# Patient Record
Sex: Female | Born: 2000 | Race: Black or African American | Hispanic: No | Marital: Single | Smoking: Never smoker
Health system: Southern US, Community
[De-identification: ages and names within clinical notes are randomized; demographics above are authoritative.]

---

## 2019-05-28 ENCOUNTER — Emergency Department (HOSPITAL_COMMUNITY)
Admission: EM | Admit: 2019-05-28 | Discharge: 2019-05-29 | Disposition: A | Discharge status: Home / Self Care | Payer: 59 | Attending: Emergency Medicine | Admitting: Emergency Medicine

## 2019-05-28 ENCOUNTER — Other Ambulatory Visit: Payer: Self-pay

## 2019-05-28 ENCOUNTER — Encounter (HOSPITAL_COMMUNITY): Payer: Self-pay

## 2019-05-28 ENCOUNTER — Emergency Department (HOSPITAL_COMMUNITY): Payer: 59

## 2019-05-28 DIAGNOSIS — N12 Tubulo-interstitial nephritis, not specified as acute or chronic: Secondary | ICD-10-CM

## 2019-05-28 DIAGNOSIS — N10 Acute tubulo-interstitial nephritis: Secondary | ICD-10-CM | POA: Diagnosis not present

## 2019-05-28 DIAGNOSIS — R0602 Shortness of breath: Secondary | ICD-10-CM | POA: Diagnosis present

## 2019-05-28 LAB — COMPREHENSIVE METABOLIC PANEL
ALT: 26 U/L (ref 0–44)
AST: 25 U/L (ref 15–41)
Albumin: 3.7 g/dL (ref 3.5–5.0)
Alkaline Phosphatase: 44 U/L (ref 38–126)
Anion gap: 12 (ref 5–15)
BUN: 10 mg/dL (ref 6–20)
CO2: 25 mmol/L (ref 22–32)
Calcium: 9.3 mg/dL (ref 8.9–10.3)
Chloride: 97 mmol/L — ABNORMAL LOW (ref 98–111)
Creatinine, Ser: 1.04 mg/dL — ABNORMAL HIGH (ref 0.44–1.00)
GFR calc Af Amer: >60 mL/min (ref >60)
GFR calc non Af Amer: >60 mL/min (ref >60)
Glucose, Bld: 104 mg/dL — ABNORMAL HIGH (ref 70–99)
Potassium: 2.9 mmol/L — ABNORMAL LOW (ref 3.5–5.1)
Sodium: 134 mmol/L — ABNORMAL LOW (ref 135–145)
Total Bilirubin: 1 mg/dL (ref 0.3–1.2)
Total Protein: 7.5 g/dL (ref 6.5–8.1)

## 2019-05-28 LAB — CBC WITH DIFFERENTIAL/PLATELET
Abs Immature Granulocytes: 0.15 10*3/uL — ABNORMAL HIGH (ref 0.00–0.07)
Basophils Absolute: 0.1 10*3/uL (ref 0.0–0.1)
Basophils Relative: 1 %
Eosinophils Absolute: 0 10*3/uL (ref 0.0–0.5)
Eosinophils Relative: 0 %
HCT: 33 % — ABNORMAL LOW (ref 36.0–46.0)
Hemoglobin: 9.9 g/dL — ABNORMAL LOW (ref 12.0–15.0)
Immature Granulocytes: 1 %
Lymphocytes Relative: 5 %
Lymphs Abs: 0.6 10*3/uL — ABNORMAL LOW (ref 0.7–4.0)
MCH: 21.8 pg — ABNORMAL LOW (ref 26.0–34.0)
MCHC: 30 g/dL (ref 30.0–36.0)
MCV: 72.7 fL — ABNORMAL LOW (ref 80.0–100.0)
Monocytes Absolute: 0.5 10*3/uL (ref 0.1–1.0)
Monocytes Relative: 5 %
Neutro Abs: 9.1 10*3/uL — ABNORMAL HIGH (ref 1.7–7.7)
Neutrophils Relative %: 88 %
Platelets: 224 10*3/uL (ref 150–400)
RBC: 4.54 MIL/uL (ref 3.87–5.11)
RDW: 18.4 % — ABNORMAL HIGH (ref 11.5–15.5)
WBC: 10.4 10*3/uL (ref 4.0–10.5)
nRBC: 0 % (ref 0.0–0.2)

## 2019-05-28 LAB — URINALYSIS, ROUTINE W REFLEX MICROSCOPIC
Bilirubin Urine: NEGATIVE
Glucose, UA: NEGATIVE mg/dL
Ketones, ur: 20 mg/dL — AB
Nitrite: POSITIVE — AB
Protein, ur: 30 mg/dL — AB
Specific Gravity, Urine: 1.014 (ref 1.005–1.030)
WBC, UA: >50 WBC/hpf — ABNORMAL HIGH (ref 0–5)
pH: 5 (ref 5.0–8.0)

## 2019-05-28 LAB — I-STAT BETA HCG BLOOD, ED (MC, WL, AP ONLY): I-stat hCG, quantitative: <5 m[IU]/mL (ref <5)

## 2019-05-28 LAB — LACTIC ACID, PLASMA: Lactic Acid, Venous: 1.6 mmol/L (ref 0.5–1.9)

## 2019-05-28 MED ORDER — ACETAMINOPHEN 325 MG PO TABS
650.0000 mg | ORAL_TABLET | Freq: Once | ORAL | Status: AC | PRN
  Administered 2019-05-28: 17:00:00 650 mg via ORAL
  Filled 2019-05-28: qty 2

## 2019-05-28 MED ORDER — ACETAMINOPHEN 325 MG PO TABS
650.0000 mg | ORAL_TABLET | Freq: Once | ORAL | Status: AC
  Administered 2019-05-28: 650 mg via ORAL
  Filled 2019-05-28: qty 2

## 2019-05-28 MED ORDER — SODIUM CHLORIDE 0.9% FLUSH
3.0000 mL | Freq: Once | INTRAVENOUS | Status: AC
  Administered 2019-05-29: 3 mL via INTRAVENOUS

## 2019-05-28 NOTE — ED Triage Notes (Signed)
Referred from [facility] for a sepsis workup, acute pyelonephritis

## 2019-05-29 ENCOUNTER — Other Ambulatory Visit: Payer: Self-pay

## 2019-05-29 DIAGNOSIS — N10 Acute tubulo-interstitial nephritis: Secondary | ICD-10-CM | POA: Diagnosis not present

## 2019-05-29 MED ORDER — POTASSIUM CHLORIDE CRYS ER 20 MEQ PO TBCR
40.0000 meq | EXTENDED_RELEASE_TABLET | Freq: Once | ORAL | Status: AC
  Administered 2019-05-29: 40 meq via ORAL
  Filled 2019-05-29: qty 2

## 2019-05-29 MED ORDER — SODIUM CHLORIDE 0.9 % IV BOLUS (SEPSIS)
1000.0000 mL | Freq: Once | INTRAVENOUS | Status: AC
  Administered 2019-05-29: 1000 mL via INTRAVENOUS

## 2019-05-29 MED ORDER — CEPHALEXIN 500 MG PO CAPS: 500.0000 mg | ORAL_CAPSULE | Freq: Four times a day (QID) | ORAL | 0 refills | Status: DC

## 2019-05-29 MED ORDER — SODIUM CHLORIDE 0.9 % IV SOLN
1.0000 g | Freq: Once | INTRAVENOUS | Status: AC
  Administered 2019-05-29: 04:00:00 1 g via INTRAVENOUS
  Filled 2019-05-29: qty 10

## 2019-05-29 MED ORDER — KETOROLAC TROMETHAMINE 30 MG/ML IJ SOLN
30.0000 mg | Freq: Once | INTRAMUSCULAR | Status: AC
  Administered 2019-05-29: 30 mg via INTRAVENOUS
  Filled 2019-05-29: qty 1

## 2019-05-29 MED ORDER — ONDANSETRON HCL 4 MG/2ML IJ SOLN
4.0000 mg | Freq: Once | INTRAMUSCULAR | Status: AC
  Administered 2019-05-29: 4 mg via INTRAVENOUS
  Filled 2019-05-29: qty 2

## 2019-05-29 NOTE — ED Provider Notes (Signed)
[facility] Provider Note   CSN: [id number] Arrival date & time: 05/28/19  1503     History   Chief Complaint Chief Complaint  Patient presents with  . Shortness of Breath  . Pyelonephritis    HPI Elizabeth Brandt is a 18 y.o. female.     The history is provided by the patient and a parent.  Back Pain Location:  Generalized Quality:  Aching Pain severity:  Moderate Onset quality:  Gradual Timing:  Intermittent Progression:  Unchanged Chronicity:  New Relieved by:  Nothing Worsened by:  Nothing Associated symptoms: abdominal pain and fever   Associated symptoms: no dysuria   Patient is an otherwise healthy female who presents with multiple complaints.  She reports for the past 3 days she has had body aches, fevers, chills, headache.  She also reports abdominal pain as well as back pain.  She has had mild cough in the past 24 hours She was seen at an urgent care and told she had a urine infection and possible sepsis and was sent to the ER. She denies dysuria or urinary symptoms  PMH-none Soc hx - nonsmoker OB History   No obstetric history on file.      Home Medications    Prior to Admission medications   Not on File    Family History History reviewed. No pertinent family history.  Social History Social History   Tobacco Use  . Smoking status: Not on file  Substance Use Topics  . Alcohol use: Not on file  . Drug use: Not on file     Allergies   Patient has no known allergies.   Review of Systems Review of Systems  Constitutional: Positive for fever.  Respiratory: Positive for cough.   Gastrointestinal: Positive for abdominal pain.  Genitourinary: Negative for dysuria and vaginal bleeding.  Musculoskeletal: Positive for back pain.  All other systems reviewed and are negative.    Physical Exam Updated Vital Signs BP 111/70 (BP Location: Right Arm)   Pulse 95   Temp (!) 102.2 F (39 C) (Oral)    Resp 18   LMP 05/14/2019   SpO2 100%   Physical Exam CONSTITUTIONAL: Well developed/well nourished HEAD: Normocephalic/atraumatic EYES: EOMI/PERRL ENMT: Mucous membranes moist NECK: supple no meningeal signs SPINE/BACK:entire spine nontender CV: S1/S2 noted, no murmurs/rubs/gallops noted LUNGS: Lungs are clear to auscultation bilaterally, no apparent distress ABDOMEN: soft, nontender, no rebound or guarding, bowel sounds noted throughout abdomen GU: Right cva tenderness NEURO: Pt is awake/alert/appropriate, moves all extremitiesx4.  No facial droop.   EXTREMITIES: pulses normal/equal, full ROM SKIN: warm, color normal PSYCH: no abnormalities of mood noted, alert and oriented to situation   ED Treatments / Results  Labs (all labs ordered are listed, but only abnormal results are displayed) Labs Reviewed  COMPREHENSIVE METABOLIC PANEL - Abnormal; Notable for the following components:      Result Value   Sodium 134 (*)    Potassium 2.9 (*)    Chloride 97 (*)    Glucose, Bld 104 (*)    Creatinine, Ser 1.04 (*)    All other components within normal limits  CBC WITH DIFFERENTIAL/PLATELET - Abnormal; Notable for the following components:   Hemoglobin 9.9 (*)    HCT 33.0 (*)    MCV 72.7 (*)    MCH 21.8 (*)    RDW 18.4 (*)    Neutro Abs 9.1 (*)    Lymphs Abs 0.6 (*)    Abs Immature Granulocytes 0.15 (*)  All other components within normal limits  URINALYSIS, ROUTINE W REFLEX MICROSCOPIC - Abnormal; Notable for the following components:   Color, Urine AMBER (*)    APPearance HAZY (*)    Hgb urine dipstick SMALL (*)    Ketones, ur 20 (*)    Protein, ur 30 (*)    Nitrite POSITIVE (*)    Leukocytes,Ua LARGE (*)    WBC, UA >50 (*)    Bacteria, UA FEW (*)    All other components within normal limits  CULTURE, BLOOD (ROUTINE X 2)  URINE CULTURE  LACTIC ACID, PLASMA  I-STAT BETA HCG BLOOD, ED ([facility], [facility], [facility] ONLY)    EKG None  Radiology Dg Chest 2 View  Result Date:  05/28/2019 CLINICAL DATA:  Shortness of breath, fever, and chest pain EXAM: CHEST - 2 VIEW COMPARISON:  None. FINDINGS: Lungs are clear. Heart size and pulmonary vascularity are normal. No adenopathy. No bone lesions. No pneumothorax. IMPRESSION: No edema or consolidation.  No evident adenopathy. Electronically Signed   By: Lowella Grip III M.D.   On: 05/28/2019 16:07    Procedures Procedures   Medications Ordered in ED Medications  sodium chloride flush (NS) 0.9 % injection 3 mL (3 mLs Intravenous Given 05/29/19 0450)  acetaminophen (TYLENOL) tablet 650 mg (650 mg Oral Given 05/28/19 1628)  acetaminophen (TYLENOL) tablet 650 mg (650 mg Oral Given 05/28/19 1717)  sodium chloride 0.9 % bolus 1,000 mL (0 mLs Intravenous Stopped 05/29/19 0545)  ondansetron (ZOFRAN) injection 4 mg (4 mg Intravenous Given 05/29/19 0352)  ketorolac (TORADOL) 30 MG/ML injection 30 mg (30 mg Intravenous Given 05/29/19 0353)  cefTRIAXone (ROCEPHIN) 1 g in sodium chloride 0.9 % 100 mL IVPB (0 g Intravenous Stopped 05/29/19 0450)  potassium chloride SA (K-DUR) CR tablet 40 mEq (40 mEq Oral Given 05/29/19 0352)     Initial Impression / Assessment and Plan / ED Course  I have reviewed the triage vital signs and the nursing notes.  Pertinent labs & imaging results that were available during my care of the patient were reviewed by me and considered in my medical decision making (see chart for details).        3:40 AM Patient appears to have pyelonephritis.  She is not septic appearing at this time.  She will be given IV fluids as well as IV Rocephin. Anticipate discharge 5:52 AM Patient improved.  She is ambulatory.  She is not septic appearing. Plan to discharge home for treatment for pyelonephritis.  Patient agreed with plan.  We discussed return precautions Final Clinical Impressions(s) / ED Diagnoses   Final diagnoses:  Pyelonephritis    ED Discharge Orders         Ordered    cephALEXin (KEFLEX) 500 MG  capsule  4 times daily     05/29/19 0542           Ripley Fraise, MD 05/29/19 [phone]

## 2019-05-31 LAB — URINE CULTURE: Culture: >=100000 — AB

## 2019-06-01 ENCOUNTER — Telehealth (HOSPITAL_BASED_OUTPATIENT_CLINIC_OR_DEPARTMENT_OTHER): Payer: Self-pay

## 2019-06-01 NOTE — Telephone Encounter (Signed)
Post ED Visit - Positive Culture Follow-up  Culture report reviewed by antimicrobial stewardship pharmacist: [place] Team []  Elenor Quinones, Pharm.D. []  Heide Guile, Pharm.D., BCPS AQ-ID []  Parks Neptune, Pharm.D., BCPS []  Alycia Rossetti, Pharm.D., BCPS []  [place], Pharm.D., BCPS, AAHIVP []  Legrand Como, Pharm.D., BCPS, AAHIVP []  Salome Arnt, PharmD, BCPS []  Johnnette Gourd, PharmD, BCPS []  Hughes Better, PharmD, BCPS []  Leeroy Cha, PharmD []  Laqueta Linden, PharmD, BCPS []  Albertina Parr, PharmD Stacy Gardner.D.  [place] Team []  Leodis Sias, PharmD []  Lindell Spar, PharmD []  Royetta Asal, PharmD []  Graylin Shiver, Rph []  Rema Fendt) Glennon Mac, PharmD []  Arlyn Dunning, PharmD []  Netta Cedars, PharmD []  Dia Sitter, PharmD []  Leone Haven, PharmD []  Gretta Arab, PharmD []  Theodis Shove, PharmD []  Peggyann Juba, PharmD []  Reuel Boom, PharmD   Positive urine culture Treated with Cephalexin, organism sensitive to the same and no further patient follow-up is required at this time.  Dortha Kern 06/01/2019, 2:22 PM

## 2019-06-02 LAB — CULTURE, BLOOD (ROUTINE X 2)
Culture: NO GROWTH
Special Requests: ADEQUATE

## 2020-09-04 DIAGNOSIS — Z01419 Encounter for gynecological examination (general) (routine) without abnormal findings: Secondary | ICD-10-CM | POA: Diagnosis not present

## 2020-09-04 DIAGNOSIS — Z6832 Body mass index (BMI) 32.0-32.9, adult: Secondary | ICD-10-CM | POA: Diagnosis not present

## 2020-09-04 DIAGNOSIS — Z113 Encounter for screening for infections with a predominantly sexual mode of transmission: Secondary | ICD-10-CM | POA: Diagnosis not present

## 2020-09-04 DIAGNOSIS — E282 Polycystic ovarian syndrome: Secondary | ICD-10-CM | POA: Insufficient documentation

## 2020-09-18 DIAGNOSIS — J069 Acute upper respiratory infection, unspecified: Secondary | ICD-10-CM | POA: Diagnosis not present

## 2020-09-18 DIAGNOSIS — Z9189 Other specified personal risk factors, not elsewhere classified: Secondary | ICD-10-CM | POA: Diagnosis not present

## 2020-09-20 DIAGNOSIS — L700 Acne vulgaris: Secondary | ICD-10-CM | POA: Diagnosis not present

## 2020-09-20 DIAGNOSIS — L218 Other seborrheic dermatitis: Secondary | ICD-10-CM | POA: Diagnosis not present

## 2020-09-20 DIAGNOSIS — Z79899 Other long term (current) drug therapy: Secondary | ICD-10-CM | POA: Diagnosis not present

## 2020-09-22 DIAGNOSIS — Z79899 Other long term (current) drug therapy: Secondary | ICD-10-CM | POA: Diagnosis not present

## 2020-10-20 DIAGNOSIS — L700 Acne vulgaris: Secondary | ICD-10-CM | POA: Diagnosis not present

## 2020-10-20 DIAGNOSIS — Z79899 Other long term (current) drug therapy: Secondary | ICD-10-CM | POA: Diagnosis not present

## 2020-11-21 DIAGNOSIS — N926 Irregular menstruation, unspecified: Secondary | ICD-10-CM | POA: Diagnosis not present

## 2020-11-21 DIAGNOSIS — N939 Abnormal uterine and vaginal bleeding, unspecified: Secondary | ICD-10-CM | POA: Diagnosis not present

## 2020-11-22 DIAGNOSIS — Z3202 Encounter for pregnancy test, result negative: Secondary | ICD-10-CM | POA: Diagnosis not present

## 2020-11-22 DIAGNOSIS — L700 Acne vulgaris: Secondary | ICD-10-CM | POA: Diagnosis not present

## 2020-11-22 DIAGNOSIS — Z79899 Other long term (current) drug therapy: Secondary | ICD-10-CM | POA: Diagnosis not present

## 2020-12-25 DIAGNOSIS — L700 Acne vulgaris: Secondary | ICD-10-CM | POA: Diagnosis not present

## 2020-12-25 DIAGNOSIS — Z79899 Other long term (current) drug therapy: Secondary | ICD-10-CM | POA: Diagnosis not present

## 2021-01-25 DIAGNOSIS — Z79899 Other long term (current) drug therapy: Secondary | ICD-10-CM | POA: Diagnosis not present

## 2021-01-25 DIAGNOSIS — L700 Acne vulgaris: Secondary | ICD-10-CM | POA: Diagnosis not present

## 2021-02-26 DIAGNOSIS — L700 Acne vulgaris: Secondary | ICD-10-CM | POA: Diagnosis not present

## 2021-03-29 DIAGNOSIS — L700 Acne vulgaris: Secondary | ICD-10-CM | POA: Diagnosis not present

## 2021-03-29 DIAGNOSIS — Z79899 Other long term (current) drug therapy: Secondary | ICD-10-CM | POA: Diagnosis not present

## 2021-04-03 ENCOUNTER — Inpatient Hospital Stay (HOSPITAL_COMMUNITY): Payer: 59

## 2021-04-03 ENCOUNTER — Emergency Department (HOSPITAL_COMMUNITY): Payer: 59

## 2021-04-03 ENCOUNTER — Inpatient Hospital Stay (HOSPITAL_COMMUNITY)
Admission: EM | Admit: 2021-04-03 | Discharge: 2021-04-07 | DRG: 917 | Disposition: A | Discharge status: Other Acute Inpatient Hospital | Payer: 59 | Attending: Internal Medicine | Admitting: Internal Medicine

## 2021-04-03 ENCOUNTER — Other Ambulatory Visit: Payer: Self-pay

## 2021-04-03 DIAGNOSIS — E872 Acidosis: Secondary | ICD-10-CM | POA: Diagnosis not present

## 2021-04-03 DIAGNOSIS — T39312A Poisoning by propionic acid derivatives, intentional self-harm, initial encounter: Secondary | ICD-10-CM | POA: Diagnosis present

## 2021-04-03 DIAGNOSIS — T391X2A Poisoning by 4-Aminophenol derivatives, intentional self-harm, initial encounter: Secondary | ICD-10-CM

## 2021-04-03 DIAGNOSIS — T470X2A Poisoning by histamine H2-receptor blockers, intentional self-harm, initial encounter: Secondary | ICD-10-CM | POA: Diagnosis present

## 2021-04-03 DIAGNOSIS — Z452 Encounter for adjustment and management of vascular access device: Secondary | ICD-10-CM | POA: Diagnosis not present

## 2021-04-03 DIAGNOSIS — R000 Tachycardia, unspecified: Secondary | ICD-10-CM | POA: Diagnosis not present

## 2021-04-03 DIAGNOSIS — R52 Pain, unspecified: Secondary | ICD-10-CM | POA: Diagnosis not present

## 2021-04-03 DIAGNOSIS — R4020 Unspecified coma: Secondary | ICD-10-CM | POA: Diagnosis not present

## 2021-04-03 DIAGNOSIS — J9601 Acute respiratory failure with hypoxia: Secondary | ICD-10-CM | POA: Diagnosis not present

## 2021-04-03 DIAGNOSIS — J9690 Respiratory failure, unspecified, unspecified whether with hypoxia or hypercapnia: Secondary | ICD-10-CM

## 2021-04-03 DIAGNOSIS — G253 Myoclonus: Secondary | ICD-10-CM | POA: Diagnosis present

## 2021-04-03 DIAGNOSIS — N179 Acute kidney failure, unspecified: Secondary | ICD-10-CM | POA: Diagnosis not present

## 2021-04-03 DIAGNOSIS — G928 Other toxic encephalopathy: Secondary | ICD-10-CM | POA: Diagnosis present

## 2021-04-03 DIAGNOSIS — R4182 Altered mental status, unspecified: Secondary | ICD-10-CM | POA: Diagnosis not present

## 2021-04-03 DIAGNOSIS — T50901A Poisoning by unspecified drugs, medicaments and biological substances, accidental (unintentional), initial encounter: Secondary | ICD-10-CM | POA: Diagnosis present

## 2021-04-03 DIAGNOSIS — E669 Obesity, unspecified: Secondary | ICD-10-CM | POA: Diagnosis present

## 2021-04-03 DIAGNOSIS — Z4682 Encounter for fitting and adjustment of non-vascular catheter: Secondary | ICD-10-CM | POA: Diagnosis not present

## 2021-04-03 DIAGNOSIS — G9340 Encephalopathy, unspecified: Secondary | ICD-10-CM | POA: Diagnosis not present

## 2021-04-03 DIAGNOSIS — Z20822 Contact with and (suspected) exposure to covid-19: Secondary | ICD-10-CM | POA: Diagnosis not present

## 2021-04-03 DIAGNOSIS — T50902A Poisoning by unspecified drugs, medicaments and biological substances, intentional self-harm, initial encounter: Secondary | ICD-10-CM | POA: Diagnosis not present

## 2021-04-03 DIAGNOSIS — Y92009 Unspecified place in unspecified non-institutional (private) residence as the place of occurrence of the external cause: Secondary | ICD-10-CM

## 2021-04-03 DIAGNOSIS — R0902 Hypoxemia: Secondary | ICD-10-CM | POA: Diagnosis not present

## 2021-04-03 DIAGNOSIS — Z6831 Body mass index (BMI) 31.0-31.9, adult: Secondary | ICD-10-CM | POA: Diagnosis not present

## 2021-04-03 DIAGNOSIS — F32A Depression, unspecified: Secondary | ICD-10-CM | POA: Diagnosis present

## 2021-04-03 DIAGNOSIS — R9431 Abnormal electrocardiogram [ECG] [EKG]: Secondary | ICD-10-CM | POA: Diagnosis present

## 2021-04-03 DIAGNOSIS — E876 Hypokalemia: Secondary | ICD-10-CM | POA: Diagnosis present

## 2021-04-03 DIAGNOSIS — R404 Transient alteration of awareness: Secondary | ICD-10-CM | POA: Diagnosis not present

## 2021-04-03 HISTORY — DX: Poisoning by 4-aminophenol derivatives, intentional self-harm, initial encounter: T39.1X2A

## 2021-04-03 LAB — CBC WITH DIFFERENTIAL/PLATELET
Abs Immature Granulocytes: 0.03 10*3/uL (ref 0.00–0.07)
Basophils Absolute: 0.1 10*3/uL (ref 0.0–0.1)
Basophils Relative: 1 %
Eosinophils Absolute: 0 10*3/uL (ref 0.0–0.5)
Eosinophils Relative: 0 %
HCT: 42.6 % (ref 36.0–46.0)
Hemoglobin: 12.4 g/dL (ref 12.0–15.0)
Immature Granulocytes: 0 %
Lymphocytes Relative: 20 %
Lymphs Abs: 1.5 10*3/uL (ref 0.7–4.0)
MCH: 24.8 pg — ABNORMAL LOW (ref 26.0–34.0)
MCHC: 29.1 g/dL — ABNORMAL LOW (ref 30.0–36.0)
MCV: 85 fL (ref 80.0–100.0)
Monocytes Absolute: 0.7 10*3/uL (ref 0.1–1.0)
Monocytes Relative: 9 %
Neutro Abs: 5.2 10*3/uL (ref 1.7–7.7)
Neutrophils Relative %: 70 %
Platelets: 277 10*3/uL (ref 150–400)
RBC: 5.01 MIL/uL (ref 3.87–5.11)
RDW: 16.9 % — ABNORMAL HIGH (ref 11.5–15.5)
WBC: 7.5 10*3/uL (ref 4.0–10.5)
nRBC: 0 % (ref 0.0–0.2)

## 2021-04-03 LAB — I-STAT ARTERIAL BLOOD GAS, ED
Acid-base deficit: 10 mmol/L — ABNORMAL HIGH (ref 0.0–2.0)
Bicarbonate: 16.3 mmol/L — ABNORMAL LOW (ref 20.0–28.0)
Calcium, Ion: 1.12 mmol/L — ABNORMAL LOW (ref 1.15–1.40)
HCT: 39 % (ref 36.0–46.0)
Hemoglobin: 13.3 g/dL (ref 12.0–15.0)
O2 Saturation: 100 %
Potassium: 3.8 mmol/L (ref 3.5–5.1)
Sodium: 139 mmol/L (ref 135–145)
TCO2: 17 mmol/L — ABNORMAL LOW (ref 22–32)
pCO2 arterial: 37.5 mmHg (ref 32.0–48.0)
pH, Arterial: 7.246 — ABNORMAL LOW (ref 7.350–7.450)
pO2, Arterial: 540 mmHg — ABNORMAL HIGH (ref 83.0–108.0)

## 2021-04-03 LAB — POCT I-STAT 7, (LYTES, BLD GAS, ICA,H+H)
Acid-base deficit: 12 mmol/L — ABNORMAL HIGH (ref 0.0–2.0)
Bicarbonate: 13.1 mmol/L — ABNORMAL LOW (ref 20.0–28.0)
Calcium, Ion: 1.25 mmol/L (ref 1.15–1.40)
HCT: 43 % (ref 36.0–46.0)
Hemoglobin: 14.6 g/dL (ref 12.0–15.0)
O2 Saturation: 99 %
Patient temperature: 98.4
Potassium: 4.1 mmol/L (ref 3.5–5.1)
Sodium: 144 mmol/L (ref 135–145)
TCO2: 14 mmol/L — ABNORMAL LOW (ref 22–32)
pCO2 arterial: 28.2 mmHg — ABNORMAL LOW (ref 32.0–48.0)
pH, Arterial: 7.274 — ABNORMAL LOW (ref 7.350–7.450)
pO2, Arterial: 149 mmHg — ABNORMAL HIGH (ref 83.0–108.0)

## 2021-04-03 LAB — SALICYLATE LEVEL: Salicylate Lvl: <7 mg/dL — ABNORMAL LOW (ref 7.0–30.0)

## 2021-04-03 LAB — COMPREHENSIVE METABOLIC PANEL
ALT: 18 U/L (ref 0–44)
AST: 28 U/L (ref 15–41)
Albumin: 3.7 g/dL (ref 3.5–5.0)
Alkaline Phosphatase: 41 U/L (ref 38–126)
Anion gap: 20 — ABNORMAL HIGH (ref 5–15)
BUN: 12 mg/dL (ref 6–20)
CO2: 13 mmol/L — ABNORMAL LOW (ref 22–32)
Calcium: 9 mg/dL (ref 8.9–10.3)
Chloride: 107 mmol/L (ref 98–111)
Creatinine, Ser: 1.12 mg/dL — ABNORMAL HIGH (ref 0.44–1.00)
GFR, Estimated: >60 mL/min (ref >60)
Glucose, Bld: 134 mg/dL — ABNORMAL HIGH (ref 70–99)
Potassium: 3.6 mmol/L (ref 3.5–5.1)
Sodium: 140 mmol/L (ref 135–145)
Total Bilirubin: 0.2 mg/dL — ABNORMAL LOW (ref 0.3–1.2)
Total Protein: 6.8 g/dL (ref 6.5–8.1)

## 2021-04-03 LAB — PHOSPHORUS: Phosphorus: 3.1 mg/dL (ref 2.5–4.6)

## 2021-04-03 LAB — HEPATIC FUNCTION PANEL
ALT: 22 U/L (ref 0–44)
AST: 25 U/L (ref 15–41)
Albumin: 4 g/dL (ref 3.5–5.0)
Alkaline Phosphatase: 42 U/L (ref 38–126)
Bilirubin, Direct: <0.1 mg/dL (ref 0.0–0.2)
Total Bilirubin: 0.6 mg/dL (ref 0.3–1.2)
Total Protein: 7.6 g/dL (ref 6.5–8.1)

## 2021-04-03 LAB — HIV ANTIBODY (ROUTINE TESTING W REFLEX): HIV Screen 4th Generation wRfx: NONREACTIVE

## 2021-04-03 LAB — BASIC METABOLIC PANEL
Anion gap: 15 (ref 5–15)
BUN: 9 mg/dL (ref 6–20)
CO2: 14 mmol/L — ABNORMAL LOW (ref 22–32)
Calcium: 8.9 mg/dL (ref 8.9–10.3)
Chloride: 111 mmol/L (ref 98–111)
Creatinine, Ser: 1.15 mg/dL — ABNORMAL HIGH (ref 0.44–1.00)
GFR, Estimated: >60 mL/min (ref >60)
Glucose, Bld: 171 mg/dL — ABNORMAL HIGH (ref 70–99)
Potassium: 4.5 mmol/L (ref 3.5–5.1)
Sodium: 140 mmol/L (ref 135–145)

## 2021-04-03 LAB — RAPID URINE DRUG SCREEN, HOSP PERFORMED
Amphetamines: NOT DETECTED
Barbiturates: NOT DETECTED
Benzodiazepines: NOT DETECTED
Cocaine: NOT DETECTED
Opiates: NOT DETECTED
Tetrahydrocannabinol: NOT DETECTED

## 2021-04-03 LAB — AMMONIA: Ammonia: 37 umol/L — ABNORMAL HIGH (ref 9–35)

## 2021-04-03 LAB — MAGNESIUM: Magnesium: 2.2 mg/dL (ref 1.7–2.4)

## 2021-04-03 LAB — GLUCOSE, CAPILLARY
Glucose-Capillary: 170 mg/dL — ABNORMAL HIGH (ref 70–99)
Glucose-Capillary: 133 mg/dL — ABNORMAL HIGH (ref 70–99)

## 2021-04-03 LAB — MRSA NEXT GEN BY PCR, NASAL: MRSA by PCR Next Gen: NOT DETECTED

## 2021-04-03 LAB — ACETAMINOPHEN LEVEL
Acetaminophen (Tylenol), Serum: 110 ug/mL — ABNORMAL HIGH (ref 10–30)
Acetaminophen (Tylenol), Serum: 67 ug/mL — ABNORMAL HIGH (ref 10–30)

## 2021-04-03 LAB — ETHANOL: Alcohol, Ethyl (B): <10 mg/dL (ref <10)

## 2021-04-03 LAB — CBG MONITORING, ED: Glucose-Capillary: 128 mg/dL — ABNORMAL HIGH (ref 70–99)

## 2021-04-03 LAB — PROTIME-INR
INR: 1.3 — ABNORMAL HIGH (ref 0.8–1.2)
Prothrombin Time: 16.5 seconds — ABNORMAL HIGH (ref 11.4–15.2)

## 2021-04-03 LAB — TROPONIN I (HIGH SENSITIVITY)
Troponin I (High Sensitivity): 10 ng/L (ref <18)
Troponin I (High Sensitivity): 17 ng/L (ref <18)

## 2021-04-03 LAB — OSMOLALITY: Osmolality: 322 mOsm/kg (ref 275–295)

## 2021-04-03 LAB — OSMOLALITY, URINE: Osmolality, Ur: 372 mOsm/kg (ref 300–900)

## 2021-04-03 LAB — SODIUM, URINE, RANDOM: Sodium, Ur: 84 mmol/L

## 2021-04-03 MED ORDER — ACETYLCYSTEINE LOAD VIA INFUSION: 150.0000 mg/kg | Freq: Once | INTRAVENOUS | Status: DC

## 2021-04-03 MED ORDER — ORAL CARE MOUTH RINSE
15.0000 mL | OROMUCOSAL | Status: DC
  Administered 2021-04-03 – 2021-04-04 (×7): 15 mL via OROMUCOSAL

## 2021-04-03 MED ORDER — SODIUM CHLORIDE 0.9 % IV BOLUS
1000.0000 mL | Freq: Once | INTRAVENOUS | Status: AC
  Administered 2021-04-03: 1000 mL via INTRAVENOUS

## 2021-04-03 MED ORDER — POTASSIUM CHLORIDE 20 MEQ PO PACK: 40.0000 meq | PACK | Freq: Once | ORAL | Status: DC

## 2021-04-03 MED ORDER — PROPOFOL 1000 MG/100ML IV EMUL
5.0000 ug/kg/min | INTRAVENOUS | Status: DC
Start: 2021-04-03 — End: 2021-04-04
  Administered 2021-04-03: 20 ug/kg/min via INTRAVENOUS
  Administered 2021-04-03 – 2021-04-04 (×4): 50 ug/kg/min via INTRAVENOUS
  Filled 2021-04-03 (×2): qty 100

## 2021-04-03 MED ORDER — POLYETHYLENE GLYCOL 3350 17 G PO PACK: 17.0000 g | PACK | Freq: Every day | ORAL | Status: DC | PRN

## 2021-04-03 MED ORDER — PANTOPRAZOLE SODIUM 40 MG IV SOLR
40.0000 mg | INTRAVENOUS | Status: DC
  Administered 2021-04-03: 40 mg via INTRAVENOUS
  Filled 2021-04-03 (×2): qty 40

## 2021-04-03 MED ORDER — ROCURONIUM BROMIDE 50 MG/5ML IV SOLN
INTRAVENOUS | Status: AC | PRN
  Administered 2021-04-03: 100 mg via INTRAVENOUS

## 2021-04-03 MED ORDER — HEPARIN SODIUM (PORCINE) 5000 UNIT/ML IJ SOLN
5000.0000 [IU] | Freq: Three times a day (TID) | INTRAMUSCULAR | Status: DC
  Administered 2021-04-03 – 2021-04-06 (×7): 5000 [IU] via SUBCUTANEOUS
  Filled 2021-04-03 (×8): qty 1

## 2021-04-03 MED ORDER — FENTANYL CITRATE (PF) 100 MCG/2ML IJ SOLN: 50.0000 ug | INTRAMUSCULAR | Status: DC | PRN

## 2021-04-03 MED ORDER — CHLORHEXIDINE GLUCONATE CLOTH 2 % EX PADS
6.0000 | MEDICATED_PAD | Freq: Every day | CUTANEOUS | Status: DC
  Administered 2021-04-03 – 2021-04-04 (×2): 6 via TOPICAL

## 2021-04-03 MED ORDER — CHARCOAL ACTIVATED PO LIQD
100.0000 g | Freq: Once | ORAL | Status: AC
  Administered 2021-04-03: 100 g

## 2021-04-03 MED ORDER — DEXTROSE 5 % IV SOLN
15.0000 mg/kg/h | INTRAVENOUS | Status: DC
  Administered 2021-04-03 – 2021-04-04 (×3): 15 mg/kg/h via INTRAVENOUS
  Filled 2021-04-03 (×4): qty 120

## 2021-04-03 MED ORDER — SODIUM BICARBONATE 8.4 % IV SOLN
100.0000 meq | Freq: Once | INTRAVENOUS | Status: AC
  Administered 2021-04-03: 50 meq via INTRAVENOUS
  Filled 2021-04-03: qty 50

## 2021-04-03 MED ORDER — CHARCOAL ACTIVATED PO LIQD: 100.0000 g | Freq: Once | ORAL | Status: DC

## 2021-04-03 MED ORDER — ETOMIDATE 2 MG/ML IV SOLN
INTRAVENOUS | Status: AC | PRN
  Administered 2021-04-03: 20 mg via INTRAVENOUS

## 2021-04-03 MED ORDER — DOCUSATE SODIUM 100 MG PO CAPS
100.0000 mg | ORAL_CAPSULE | Freq: Two times a day (BID) | ORAL | Status: DC | PRN
Start: 2021-04-03 — End: 2021-04-04

## 2021-04-03 MED ORDER — CHLORHEXIDINE GLUCONATE 0.12% ORAL RINSE (MEDLINE KIT)
15.0000 mL | Freq: Two times a day (BID) | OROMUCOSAL | Status: DC
  Administered 2021-04-03 – 2021-04-04 (×2): 15 mL via OROMUCOSAL

## 2021-04-03 MED ORDER — SODIUM BICARBONATE 8.4 % IV SOLN
INTRAVENOUS | Status: DC
  Filled 2021-04-03 (×4): qty 1000

## 2021-04-03 MED ORDER — ACETYLCYSTEINE LOAD VIA INFUSION
150.0000 mg/kg | Freq: Once | INTRAVENOUS | Status: AC
  Administered 2021-04-03: 13875 mg via INTRAVENOUS
  Filled 2021-04-03: qty 347

## 2021-04-03 MED ORDER — POTASSIUM CHLORIDE 10 MEQ/100ML IV SOLN
10.0000 meq | INTRAVENOUS | Status: AC
Start: 2021-04-03 — End: 2021-04-04
  Administered 2021-04-03 (×4): 10 meq via INTRAVENOUS
  Filled 2021-04-03 (×4): qty 100

## 2021-04-03 NOTE — Code Documentation (Signed)
Pt vomiting upon arrival, [facility] elevated & mouth suctioned. Room set up for intubation.

## 2021-04-03 NOTE — ED Notes (Signed)
OG tube flushed with 50 ml of sterile water then clamped

## 2021-04-03 NOTE — Progress Notes (Signed)
EEG complete - results pending 

## 2021-04-03 NOTE — Progress Notes (Signed)
ABG results given to RN at this time. 

## 2021-04-03 NOTE — Procedures (Signed)
[name]  MRN: [id number]  Epilepsy Attending: Charlsie Quest  Referring Physician/Provider: Dr Lupita Leash, Date: 04/03/2021 Duration: 28.51 mins  Patient history: 19yo s/p intentional overdose. EEG to evaluate for seizure  Level of alertness:  comatose  AEDs during EEG study: Propofol  Technical aspects: This EEG study was done with scalp electrodes positioned according to the 10-20 International system of electrode placement. Electrical activity was acquired at a sampling rate of 500Hz  and reviewed with a high frequency filter of 70Hz  and a low frequency filter of 1Hz . EEG data were recorded continuously and digitally stored.   Description: EEG showed continuous generalized 2-3Hz  delta slowing admixed with an excessive amount of 15 to 18 Hz beta activity distributed symmetrically and diffusely. Hyperventilation and photic stimulation were not performed.     ABNORMALITY - Continuous slow, generalized - Excessive beta, generalized  IMPRESSION: This study is suggestive of severe diffuse encephalopathy, nonspecific etiology but likely related to sedation. The excessive beta activity seen in the background is most likely due to the effect of benzodiazepine and is a benign EEG pattern. No seizures or epileptiform discharges were seen throughout the recording.  [name] 

## 2021-04-03 NOTE — Progress Notes (Signed)
Poison Control Consult Documentation  [place] [facility] has been contacted by request of NP Selmer Dominion due to the patient's intentional overdose on acetaminophen and duexis. Patient LKW at 0900 7/26 when mom returned home today (7/26) at 1400 patient was unresponsive and empty APAP and duexis bottle found near her - Pt also believed to have ingested some HEMP/CBD lotion as well.  Labs: Tylenol level 110; Salicylate level not detectable; Ethanol not detectable  Summary  of [place] Poison Control Recommendations: Spoke with Gina at [facility].  Treatment Recommendations: (see [facility] NAC Fact sheet below signature line of this note)  -If tylenol level at baseline returns positive use that for determining treatment with matthew rubrick nomogram using 0900 as ingestion time. (Level returned at 110 mcg/ml). - Will re-check level at 1900 as well (4 hours post treatment start) -Maintain on cardiac monitor -Acidosis may be attributable to Duexis - agree with sodium bicarb -Have mom go through meds in the house wouldn't expect this degree of CNS depression from tylenol alone -Optimize potassium and magnesium -Avoid qt/qtc prolonging medications -Treat seizures with benzodiazepines 1st line and phenobarbital 2nd line  Recommendations provided to requesting physician.  Delmar Landau, PharmD, BCPS Emergency Medicine Clinical Pharmacist 04/03/2021 5:03 PM

## 2021-04-03 NOTE — Procedures (Signed)
Central Venous Catheter Insertion Procedure Note  Elizabeth Brandt  [id number]  July 02, 2001  Date:04/03/21  Time:6:23 PM   Provider Performing:Maeley Matton D Suzie Portela   Procedure: Insertion of Non-tunneled Central Venous [phone]) with US guidance (38101)   Indication(s) Medication administration  Consent Risks of the procedure as well as the alternatives and risks of each were explained to the patient and/or caregiver.  Consent for the procedure was obtained and is signed in the bedside chart  Anesthesia Topical only with 1% lidocaine   Timeout Verified patient identification, verified procedure, site/side was marked, verified correct patient position, special equipment/implants available, medications/allergies/relevant history reviewed, required imaging and test results available.  Sterile Technique Maximal sterile technique including full sterile barrier drape, hand hygiene, sterile gown, sterile gloves, mask, hair covering, sterile ultrasound probe cover (if used).  Procedure Description Area of catheter insertion was cleaned with chlorhexidine and draped in sterile fashion.  With real-time ultrasound guidance a central venous catheter was placed into the left internal jugular vein. Nonpulsatile blood flow and easy flushing noted in all ports.  The catheter was sutured in place and sterile dressing applied.     Complications/Tolerance None; patient tolerated the procedure well. Chest X-ray is ordered to verify placement for internal jugular or subclavian cannulation.   Chest x-ray is not ordered for femoral cannulation.  EBL Minimal  Specimen(s) None  JD Anselm Lis [facility] 04/03/2021, 6:23 PM  Please see [email or web address] for pager details.  From 7A-7P if no response, please call [phone]. After hours, please call ELink [phone].

## 2021-04-03 NOTE — Progress Notes (Signed)
Ventilator patient transported from ED31 to CT to 2M04 withou any complications.

## 2021-04-03 NOTE — Progress Notes (Signed)
Pt.'s mother, Carvel Getting, has taken patient's jewelery home including one ring, two nose piercings, two bracelets, one pair of earrings, and a necklace.

## 2021-04-03 NOTE — H&P (Signed)
NAME:  Elizabeth Brandt, MRN:  [id number], DOB:  2001-02-09, LOS: 0 ADMISSION DATE:  04/03/2021, CONSULTATION DATE:  04/03/2021 REFERRING MD:  Dr. Audley Hose, CHIEF COMPLAINT:  Overdose/ AMS   History of Present Illness:  HPI obtained from medical chart review and per mother at bedside, as patient remains sedated and intubated on mechanical ventilation.  20 year old female with no prior medical history, non smoker, non drinker per mother, does not take regular medications who was found altered and minimally responsive at home.   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  She came home around 0900 however her mother had to leave for an appointment, but reported she was tired and going to bed.  Mother states she was contacted by patient's boyfriend of two years, that they broke up this morning and he was concerned cause she was saying weird things- like wanting to end it all.  Mother non specific.  No prior hx of depression/ SI/ or OD attempts.  Mother returned home to find her minimally responsive and called 911.  EMS reports call came in at 1346.  She was found with an empty bottle of tylenol gel caps - 500 mg, 50 in quantity- bottle empty, empty bottle of moms old rx from 2019, Duexis (ibuprofen/ famotide) 800/26.6, quantity of 90 tabs but mom unclear how many were left.  Also there was a half empty bottle of pink hemp lotion, ?unclear if patient was drinking but EMS found pink liquid all in her bed.  Her initial vitals per EMS were stable and normal saturations on room air.    On arrival to the ER, patient was minimally responsive and started vomiting.  She was emergently intubated for airway protection.  Labs pending.  Started on NAC and given activated charcoal per OGT.  No contents were suctioned after OGT insertion.  PCCM called for admit.   Pertinent  Medical History  None  Significant Hospital Events: Including procedures, antibiotic  start and stop dates in addition to other pertinent events   Admitted after Tylenol OD, possible ibuprofen/ famotide component, started on NAC  Interim History / Subjective:   Objective   Blood pressure (!) 198/111, pulse (!) 150, resp. rate (!) 28, height 5\' 7"  (1.702 m), weight 92.5 kg, SpO2 100 %.    Vent Mode: PRVC FiO2 (%):  [100 %] 100 % Set Rate:  [18 bmp] 18 bmp Vt Set:  [500 mL] 500 mL PEEP:  [5 cmH20] 5 cmH20 Plateau Pressure:  [15 cmH20] 15 cmH20  No intake or output data in the 24 hours ending 04/03/21 1532 Filed Weights   04/03/21 1507  Weight: 92.5 kg    Examination: General:  critically ill appearing young female intubated/ sedated/ paralyzed on MV HEENT: MM pink/moist, ETT/ OGT, pupils 4/reactive Neuro: sedated/ paralyzed, reportedly MAE prior to intubation but not f/c CV: ST, no murmur PULM:  rhonchi in RUL, otherwise clear on MV GI: soft, hypo BS Extremities: warm/dry, no LE edema  Skin: no rashes, no cuts  Resolved Hospital Problem list     Assessment & Plan:   Toxic-metabolic encephalopathy  Intentional OD- presumed tylenol, possible ibuprofen/ famotidine component, and ?drinking hemp lotion - poison control to be notified per pharmacy- see note for full recommendations, however, some concern that tylenol OD would not cause this level of AMS, to look for other causes/ co-ingestions - s/p activated charcoal in ER - initial tylenol level 110,  presumed ingestion time around 0900, recheck at 1900 then daily per recs  - continue NAC as she is above treatment nomogram - trend INR q 12, LFTs daily (LFTs wnl on initial labs) - UA pending - neg salicylate, ETOH level, and UDS neg - check volatiles, serum osm/ urine osm - pending CTH - seizure precautions, benzo first line for seizure - monitor CBG q 4 - will need sitter/ SI precautions and psych consult after acute illness/ extubation   Prolonged Qtc - 507, continue cardiac monitoring - repeat EKG  overnight - optimize K/ Mag - avoid Qtc prolonging agents    Acute respiratory insufficiency related to above Possible aspiration pneumonia - Continue MV support, 8cc/kg IBW with goal Pplat <30 and DP<15  - VAP prevention protocol/ PPI - PAD protocol for sedation> propofol/ fentanyl prn, RASS goal 0/-1 - wean FiO2 as able for SpO2 >92%  - daily SAT & SBT - ABG 1hr post intubation - 7.246/ 37.5/ 540/ 16.3-> increase rate to 20 - hold on abx for now, oxygenating well   AGMA AKI - pending UA - bicarb gtt in dextrose as above - BMETs q 4, monitor closely for hypokalemia on bicarb - place foley  - K 3.6, will start KCL runs given s/p activated charcoal  - Trend BMP / urinary output/ strict I/Os - Replace electrolytes as indicated - Avoid nephrotoxic agents, ensure adequate renal perfusion   Best Practice (right click and "Reselect all SmartList Selections" daily)   Diet/type: NPO DVT prophylaxis: SCD, pending labs to add VTE GI prophylaxis: PPI Lines: N/A Foley:  Yes, and it is still needed- ordered Code Status:  full code Last date of multidisciplinary goals of care discussion [mother at bedside, Carvel Getting [phone]  Labs   CBC: No results for input(s): WBC, NEUTROABS, HGB, HCT, MCV, PLT in the last 168 hours.  Basic Metabolic Panel: No results for input(s): NA, K, CL, CO2, GLUCOSE, BUN, CREATININE, CALCIUM, MG, PHOS in the last 168 hours. GFR: CrCl cannot be calculated (Patient's most recent lab result is older than the maximum 21 days allowed.). No results for input(s): PROCALCITON, WBC, LATICACIDVEN in the last 168 hours.  Liver Function Tests: No results for input(s): AST, ALT, ALKPHOS, BILITOT, PROT, ALBUMIN in the last 168 hours. No results for input(s): LIPASE, AMYLASE in the last 168 hours. No results for input(s): AMMONIA in the last 168 hours.  ABG No results found for: PHART, PCO2ART, PO2ART, HCO3, TCO2, ACIDBASEDEF, O2SAT   Coagulation  Profile: No results for input(s): INR, PROTIME in the last 168 hours.  Cardiac Enzymes: No results for input(s): CKTOTAL, CKMB, CKMBINDEX, TROPONINI in the last 168 hours.  HbA1C: No results found for: HGBA1C  CBG: Recent Labs  Lab 04/03/21 1456  GLUCAP 128*    Review of Systems:   Unable   Past Medical History:  She,  has no past medical history on file.   Surgical History:  No past surgical history on file.   Social History:    Mother reports pt does not smoke, drink ETOH or use drugs  Family History:  Her family history is not on file.   Allergies No Known Allergies   Home Medications  Prior to Admission medications   Medication Sig Start Date End Date Taking? Authorizing Provider  cephALEXin (KEFLEX) 500 MG capsule Take 1 capsule (500 mg total) by mouth 4 (four) times daily. 05/29/19   Zadie Rhine, MD     Critical care time: 45 mins  Posey Boyer, ACNP [facility] [facility] 04/03/2021, 5:24 PM  See Amion for pager If no response to pager, please call PCCM consult pager After 7:00 pm call Elink

## 2021-04-03 NOTE — Progress Notes (Signed)
Poison Control Consult Documentation  [place] [facility] Control has been contacted by request of NP Selmer Dominion due to the patient's intentional overdose on acetaminophen and duexis. Patient LKW at 0900 7/26 when mom returned home today (7/26) at 1400 patient was unresponsive and empty APAP and duexis bottle found near her - Pt also believed to have ingested some HEMP/CBD lotion as well.  Labs: Tylenol level 110> 67  Summary  of  Poison Control Recommendations: Spoke with [facility].  Treatment Recommendations: (see [facility] NAC Fact sheet below signature line of this note) -Continue acetadote -Recheck tylenol level in the morning -Optimize potassium and magnesium -Avoid qt/qtc prolonging medications  Recommendations provided to requesting physician.  Harland German, PharmD Clinical Pharmacist **Pharmacist phone directory can now be found on [email or web address] ([email or web address]).  Listed under [facility].

## 2021-04-03 NOTE — ED Triage Notes (Signed)
Pt BIB [facility] d/t being unresponsive. Pt's mother reports that she was last seen normal at approx 0900. Mother reports pt broke up with her significant other recently & was reported to have an empty bottle of Tylenol & Duexis as well as a empty tube of Hemp lotion. Upon arrival to ED pt was unresponsive & vomiting, EMS reports possible aspiration risk. HOB was elevated & mouth suctioned.

## 2021-04-03 NOTE — Progress Notes (Signed)
eLink Physician-Brief Progress Note [name] DOB: 2001-09-07 MRN: [id number]   Date of Service  04/03/2021  HPI/Events of Note  Nursing reporting serum osmolality from 5:06 PM = 322. Increase serum osmolality is c/w Acetaminophen OD. The patient has already been started on Acetylcysteine.  eICU Interventions  Continue present management.      Intervention Category Major Interventions: Other:  Vivia Rosenburg Dennard Nip 04/03/2021, 8:00 PM

## 2021-04-03 NOTE — Code Documentation (Signed)
[facility] EDP at [facility] for intubation.

## 2021-04-03 NOTE — ED Notes (Signed)
Mcquad MD at bedside assisting OG tube insertion.

## 2021-04-03 NOTE — ED Notes (Signed)
This RN attempted to call report.  

## 2021-04-03 NOTE — ED Provider Notes (Signed)
[place] [facility] [place] EMERGENCY DEPARTMENT Provider Note   CSN: [id number] Arrival date & time: 04/03/21  1448     History Chief Complaint  Patient presents with   unresponsive    Elizabeth Brandt is a 20 y.o. female.  Patient presents altered with history provided by mother.  She reportedly broke up with her boyfriend today.  Mother seen her last well at 51 AM and had left her alone at home.  When she returned at approximately 2 PM she found the patient in her bed unresponsive.  There was a bottle of Tylenol which appear to be new that was completely empty.  There was also empty bottles of Motrin.  And at the scene she appeared to have ingested several pumps of a moisturizing lotion.  No additional history able to obtain from the patient herself as she is obtunded.      No past medical history on file.  Patient Active Problem List   Diagnosis Date Noted   Overdose 04/03/2021    No past surgical history on file.   OB History   No obstetric history on file.     No family history on file.     Home Medications Prior to Admission medications   Medication Sig Start Date End Date Taking? Authorizing Provider  cephALEXin (KEFLEX) 500 MG capsule Take 1 capsule (500 mg total) by mouth 4 (four) times daily. 05/29/19   Ripley Fraise, MD    Allergies    Patient has no known allergies.  Review of Systems   Review of Systems  Unable to perform ROS: Mental status change   Physical Exam Updated Vital Signs BP (!) 198/111   Pulse (!) 150   Resp (!) 28   Ht _0  (1.702 m)   Wt 92.5 kg   SpO2 100%   BMI 31.95 kg/m   Physical Exam Constitutional:      Appearance: Normal appearance. She is ill-appearing and toxic-appearing.     Comments: Severely obtunded  HENT:     Head: Normocephalic.     Nose: Nose normal.  Cardiovascular:     Comments: Appears to be breathing on her own Pulmonary:     Effort: Pulmonary effort is normal.     Comments: No gag  reflex Musculoskeletal:     Cervical back: Normal range of motion.     Comments: Has some withdrawal pain with bilateral upper extremities.  Neurological:     Comments: Severely obtunded    ED Results / Procedures / Treatments   Labs (all labs ordered are listed, but only abnormal results are displayed) Labs Reviewed  CBC WITH DIFFERENTIAL/PLATELET - Abnormal; Notable for the following components:      Result Value   MCH 24.8 (*)    MCHC 29.1 (*)    RDW 16.9 (*)    All other components within normal limits  CBG MONITORING, ED - Abnormal; Notable for the following components:   Glucose-Capillary 128 (*)    All other components within normal limits  COMPREHENSIVE METABOLIC PANEL  SALICYLATE LEVEL  ACETAMINOPHEN LEVEL  ETHANOL  RAPID URINE DRUG SCREEN, HOSP PERFORMED  ACETAMINOPHEN LEVEL  BLOOD GAS, ARTERIAL  HIV ANTIBODY (ROUTINE TESTING W REFLEX)  MAGNESIUM  PHOSPHORUS  BASIC METABOLIC PANEL  BASIC METABOLIC PANEL  BASIC METABOLIC PANEL  ACETAMINOPHEN LEVEL  URINALYSIS, ROUTINE W REFLEX MICROSCOPIC  POC URINE PREG, ED  TROPONIN I (HIGH SENSITIVITY)    EKG EKG Interpretation  Date/Time:  Tuesday April 03 2021 14:50:07 EDT  Ventricular Rate:  134 PR Interval:  169 QRS Duration: 83 QT Interval:  339 QTC Calculation: 507 R Axis:   70 Text Interpretation: Sinus tachycardia Probable left atrial enlargement Abnormal T, consider ischemia, diffuse leads Prolonged QT interval Confirmed by Thamas Jaegers ([id number]) on 04/03/2021 3:08:13 PM  Radiology No results found.  Procedures .Critical Care  Date/Time: 04/03/2021 3:36 PM Performed by: Luna Fuse, MD Authorized by: Luna Fuse, MD   Critical care provider statement:    Critical care time (minutes):  30   Critical care time was exclusive of:  Separately billable procedures and treating other patients and teaching time   Critical care was necessary to treat or prevent imminent or life-threatening deterioration of  the following conditions:  Toxidrome, respiratory failure and CNS failure or compromise Comments:         Date/Time: 04/03/2021 3:37 PM Performed by: Luna Fuse, MD Comments: 7.5 ET tube used for intubation for airway protection.  Rocuronium 100 mg and etomidate 20 mg given for RSI prior to intubation.  Cords visualized with glide scope clearly, 100% oxygenation.  No desaturations noted.  Tube secured at 23 cm at the teeth.  Breath sounds equal bilaterally.  Misting seen in the tube.  Equal chest rise noted.       Medications Ordered in ED Medications  propofol (DIPRIVAN) 1000 MG/100ML infusion (20 mcg/kg/min  92.5 kg Intravenous New Bag/Given 04/03/21 1456)  acetylcysteine (ACETADOTE) 40 mg/mL load via infusion 13,875 mg (has no administration in time range)    Followed by  acetylcysteine (ACETADOTE) 24,000 mg in dextrose 5 % 600 mL (40 mg/mL) infusion (has no administration in time range)  charcoal activated (NO SORBITOL) (ACTIDOSE-AQUA) suspension 100 g (has no administration in time range)  docusate sodium (COLACE) capsule 100 mg (has no administration in time range)  polyethylene glycol (MIRALAX / GLYCOLAX) packet 17 g (has no administration in time range)  chlorhexidine gluconate (MEDLINE KIT) (PERIDEX) 0.12 % solution 15 mL (has no administration in time range)  MEDLINE mouth rinse (has no administration in time range)  pantoprazole (PROTONIX) injection 40 mg (has no administration in time range)  fentaNYL (SUBLIMAZE) injection 50 mcg (has no administration in time range)  fentaNYL (SUBLIMAZE) injection 50-200 mcg (has no administration in time range)  sodium bicarbonate injection 100 mEq (has no administration in time range)  sodium bicarbonate 150 mEq in dextrose 5 % 1,150 mL infusion (has no administration in time range)  etomidate (AMIDATE) injection (20 mg Intravenous Given 04/03/21 1453)  rocuronium (ZEMURON) injection (100 mg Intravenous Given 04/03/21 1454)  sodium  chloride 0.9 % bolus 1,000 mL (1,000 mLs Intravenous New Bag/Given 04/03/21 1456)    ED Course  I have reviewed the triage vital signs and the nursing notes.  Pertinent labs & imaging results that were available during my care of the patient were reviewed by me and considered in my medical decision making (see chart for details).    MDM Rules/Calculators/A&P                           I was called immediately to her bedside.  She had vomited upon arrival here in the ER.  Vomitus was being suctioned from her mouth.  She did not appear to be controlling her airway.  Decision made to intubate the patient.  Please see intubation note.  Patient empirically started on 150 mg of an acetylcysteine.  High clinical suspicion of Tylenol ingestion  overdose.  Critical care labs ordered and pending.  NG tube placed with activated charcoal ordered after confirmation of placement.  ICU consultation requested for admission.  Final Clinical Impression(s) / ED Diagnoses Final diagnoses:  Intentional acetaminophen overdose, initial encounter ([place])  Acute encephalopathy  Respiratory failure, unspecified chronicity, unspecified whether with hypoxia or hypercapnia [facility])    Rx / DC Orders ED Discharge Orders     None        Luna Fuse, MD 04/03/21 1538

## 2021-04-03 NOTE — ED Notes (Signed)
Xray verified tube placement.

## 2021-04-04 DIAGNOSIS — T391X2A Poisoning by 4-Aminophenol derivatives, intentional self-harm, initial encounter: Secondary | ICD-10-CM | POA: Diagnosis not present

## 2021-04-04 LAB — URINALYSIS, ROUTINE W REFLEX MICROSCOPIC
Bacteria, UA: NONE SEEN
Bilirubin Urine: NEGATIVE
Glucose, UA: NEGATIVE mg/dL
Hgb urine dipstick: NEGATIVE
Ketones, ur: 80 mg/dL — AB
Leukocytes,Ua: NEGATIVE
Nitrite: NEGATIVE
Protein, ur: 30 mg/dL — AB
Specific Gravity, Urine: 1.018 (ref 1.005–1.030)
pH: 5 (ref 5.0–8.0)

## 2021-04-04 LAB — CBC
HCT: 40.2 % (ref 36.0–46.0)
Hemoglobin: 12.4 g/dL (ref 12.0–15.0)
MCH: 24.8 pg — ABNORMAL LOW (ref 26.0–34.0)
MCHC: 30.8 g/dL (ref 30.0–36.0)
MCV: 80.6 fL (ref 80.0–100.0)
Platelets: 300 10*3/uL (ref 150–400)
RBC: 4.99 MIL/uL (ref 3.87–5.11)
RDW: 17 % — ABNORMAL HIGH (ref 11.5–15.5)
WBC: 6 10*3/uL (ref 4.0–10.5)
nRBC: 0 % (ref 0.0–0.2)

## 2021-04-04 LAB — GLUCOSE, CAPILLARY
Glucose-Capillary: 131 mg/dL — ABNORMAL HIGH (ref 70–99)
Glucose-Capillary: 142 mg/dL — ABNORMAL HIGH (ref 70–99)
Glucose-Capillary: 117 mg/dL — ABNORMAL HIGH (ref 70–99)
Glucose-Capillary: 105 mg/dL — ABNORMAL HIGH (ref 70–99)
Glucose-Capillary: 119 mg/dL — ABNORMAL HIGH (ref 70–99)
Glucose-Capillary: 112 mg/dL — ABNORMAL HIGH (ref 70–99)

## 2021-04-04 LAB — COMPREHENSIVE METABOLIC PANEL
ALT: 21 U/L (ref 0–44)
ALT: 19 U/L (ref 0–44)
ALT: 22 U/L (ref 0–44)
AST: 22 U/L (ref 15–41)
AST: 22 U/L (ref 15–41)
AST: 26 U/L (ref 15–41)
Albumin: 3.3 g/dL — ABNORMAL LOW (ref 3.5–5.0)
Albumin: 3 g/dL — ABNORMAL LOW (ref 3.5–5.0)
Albumin: 3.1 g/dL — ABNORMAL LOW (ref 3.5–5.0)
Alkaline Phosphatase: 38 U/L (ref 38–126)
Alkaline Phosphatase: 36 U/L — ABNORMAL LOW (ref 38–126)
Alkaline Phosphatase: 35 U/L — ABNORMAL LOW (ref 38–126)
Anion gap: 13 (ref 5–15)
Anion gap: 14 (ref 5–15)
Anion gap: 10 (ref 5–15)
BUN: 8 mg/dL (ref 6–20)
BUN: 7 mg/dL (ref 6–20)
BUN: 9 mg/dL (ref 6–20)
CO2: 18 mmol/L — ABNORMAL LOW (ref 22–32)
CO2: 20 mmol/L — ABNORMAL LOW (ref 22–32)
CO2: 24 mmol/L (ref 22–32)
Calcium: 8.8 mg/dL — ABNORMAL LOW (ref 8.9–10.3)
Calcium: 8 mg/dL — ABNORMAL LOW (ref 8.9–10.3)
Calcium: 8 mg/dL — ABNORMAL LOW (ref 8.9–10.3)
Chloride: 111 mmol/L (ref 98–111)
Chloride: 108 mmol/L (ref 98–111)
Chloride: 107 mmol/L (ref 98–111)
Creatinine, Ser: 1.37 mg/dL — ABNORMAL HIGH (ref 0.44–1.00)
Creatinine, Ser: 1.47 mg/dL — ABNORMAL HIGH (ref 0.44–1.00)
Creatinine, Ser: 1.85 mg/dL — ABNORMAL HIGH (ref 0.44–1.00)
GFR, Estimated: 57 mL/min — ABNORMAL LOW (ref >60)
GFR, Estimated: 52 mL/min — ABNORMAL LOW (ref >60)
GFR, Estimated: 40 mL/min — ABNORMAL LOW (ref >60)
Glucose, Bld: 144 mg/dL — ABNORMAL HIGH (ref 70–99)
Glucose, Bld: 150 mg/dL — ABNORMAL HIGH (ref 70–99)
Glucose, Bld: 113 mg/dL — ABNORMAL HIGH (ref 70–99)
Potassium: 3.7 mmol/L (ref 3.5–5.1)
Potassium: 2.6 mmol/L — CL (ref 3.5–5.1)
Potassium: 3.3 mmol/L — ABNORMAL LOW (ref 3.5–5.1)
Sodium: 142 mmol/L (ref 135–145)
Sodium: 142 mmol/L (ref 135–145)
Sodium: 141 mmol/L (ref 135–145)
Total Bilirubin: 0.7 mg/dL (ref 0.3–1.2)
Total Bilirubin: 0.6 mg/dL (ref 0.3–1.2)
Total Bilirubin: 1.8 mg/dL — ABNORMAL HIGH (ref 0.3–1.2)
Total Protein: 6.3 g/dL — ABNORMAL LOW (ref 6.5–8.1)
Total Protein: 5.8 g/dL — ABNORMAL LOW (ref 6.5–8.1)
Total Protein: 5.9 g/dL — ABNORMAL LOW (ref 6.5–8.1)

## 2021-04-04 LAB — VOLATILES,BLD-ACETONE,ETHANOL,ISOPROP,METHANOL
Acetone, blood: <0.01 g/dL (ref 0.000–0.010)
Ethanol, blood: <0.01 g/dL (ref 0.000–0.010)
Isopropanol, blood: <0.01 g/dL (ref 0.000–0.010)
Methanol, blood: <0.01 g/dL (ref 0.000–0.010)

## 2021-04-04 LAB — ACETAMINOPHEN LEVEL
Acetaminophen (Tylenol), Serum: 39 ug/mL — ABNORMAL HIGH (ref 10–30)
Acetaminophen (Tylenol), Serum: 11 ug/mL (ref 10–30)

## 2021-04-04 LAB — PROTIME-INR
INR: 1.4 — ABNORMAL HIGH (ref 0.8–1.2)
INR: 1.5 — ABNORMAL HIGH (ref 0.8–1.2)
Prothrombin Time: 16.7 seconds — ABNORMAL HIGH (ref 11.4–15.2)
Prothrombin Time: 18.4 seconds — ABNORMAL HIGH (ref 11.4–15.2)

## 2021-04-04 LAB — SALICYLATE LEVEL: Salicylate Lvl: <7 mg/dL — ABNORMAL LOW (ref 7.0–30.0)

## 2021-04-04 MED ORDER — POTASSIUM CHLORIDE CRYS ER 20 MEQ PO TBCR: 40.0000 meq | EXTENDED_RELEASE_TABLET | Freq: Two times a day (BID) | ORAL | Status: DC

## 2021-04-04 MED ORDER — LACTATED RINGERS IV SOLN: INTRAVENOUS | Status: DC

## 2021-04-04 MED ORDER — WHITE PETROLATUM EX OINT
TOPICAL_OINTMENT | CUTANEOUS | Status: AC
  Filled 2021-04-04: qty 28.35

## 2021-04-04 MED ORDER — SODIUM CHLORIDE 0.9% FLUSH
10.0000 mL | Freq: Two times a day (BID) | INTRAVENOUS | Status: DC
  Administered 2021-04-04 – 2021-04-06 (×5): 10 mL

## 2021-04-04 MED ORDER — CHLORHEXIDINE GLUCONATE CLOTH 2 % EX PADS: 6.0000 | MEDICATED_PAD | Freq: Every day | CUTANEOUS | Status: DC

## 2021-04-04 MED ORDER — DOCUSATE SODIUM 50 MG/5ML PO LIQD: 100.0000 mg | Freq: Two times a day (BID) | ORAL | Status: DC | PRN

## 2021-04-04 MED ORDER — POTASSIUM CHLORIDE 10 MEQ/50ML IV SOLN: 10.0000 meq | INTRAVENOUS | Status: DC

## 2021-04-04 MED ORDER — POTASSIUM CHLORIDE 10 MEQ/100ML IV SOLN: 10.0000 meq | INTRAVENOUS | Status: DC

## 2021-04-04 MED ORDER — SODIUM CHLORIDE 0.9% FLUSH: 10.0000 mL | INTRAVENOUS | Status: DC | PRN

## 2021-04-04 MED ORDER — POTASSIUM CHLORIDE 10 MEQ/50ML IV SOLN
10.0000 meq | INTRAVENOUS | Status: AC
  Administered 2021-04-04 (×8): 10 meq via INTRAVENOUS
  Filled 2021-04-04 (×8): qty 50

## 2021-04-04 MED ORDER — SODIUM CHLORIDE 0.9 % IV SOLN
INTRAVENOUS | Status: DC | PRN
  Administered 2021-04-04: 250 mL via INTRAVENOUS
  Administered 2021-04-04: 1000 mL via INTRAVENOUS

## 2021-04-04 MED ORDER — POLYETHYLENE GLYCOL 3350 17 G PO PACK: 17.0000 g | PACK | Freq: Every day | ORAL | Status: DC | PRN

## 2021-04-04 MED ORDER — CHLORHEXIDINE GLUCONATE CLOTH 2 % EX PADS
6.0000 | MEDICATED_PAD | Freq: Every day | CUTANEOUS | Status: DC
  Administered 2021-04-04: 6 via TOPICAL

## 2021-04-04 NOTE — Progress Notes (Signed)
Poison Control Consult Documentation  [place] [facility] has been contacted by request of NP [facility] due to the patient's intentional overdose on acetaminophen and duexis (ibuprofen/famotidine). Patient LKW at 0900 7/26 when mom returned home today (7/26) at 1400 patient was unresponsive and empty APAP and duexis bottle found near her - Pt also believed to have ingested some HEMP/CBD lotion as well.  Labs: Tylenol level 110>67>39>11 Salicylate undetectable (<7) Qtc 549 in setting of hypokalemia (2.6)  Summary  of [place] [facility] Recommendations: Spoke with Denice at [facility].  Treatment Recommendations: (see [facility] NAC Fact sheet below signature line of this note) -Continue acetadote for another 24 hours -Recheck tylenol level and LFTs 7/28 @ 1500 -Optimize potassium and magnesium -Avoid qt/qtc prolonging medications  Recommendations provided to requesting physician.  Filbert Schilder, PharmD PGY1 Pharmacy Resident 04/04/2021  5:07 PM  Please check [email or web address] for unit-specific pharmacy phone numbers.

## 2021-04-04 NOTE — Progress Notes (Signed)
UnclearPCCM pick-up. Came with acetaminophen and NSAIDs overdose, self extubated today.  Still on Mucomyst drip, liver function within normal limits, appears to have AKI on LR. Psy consulted.

## 2021-04-04 NOTE — Consult Note (Signed)
[facility] Face-to-Face Psychiatry Consult   Reason for Consult:  Suicide attempt Referring Physician:  Lupita Leash Patient Identification: Elizabeth Brandt MRN:  [id number] Principal Diagnosis: <principal problem not specified> Diagnosis:  Active Problems:   Overdose   Encounter for central line placement   Total Time spent with patient: 30 minutes  Subjective:   Elizabeth Brandt is a 20 y.o. female patient admitted with acetominophen and ibuprofen overdose.  HPI:    Pt is a 20 yo female w/ hx of acne vulgaris treated w/ isotretinoin and no significant psychiatric history admitted to [facility] due to overdose with ibuprofen and tylenol.  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 Pt was found unresponsive near a empty bottlle of tylenol and a partially full bottle of duexis. EMS called and patient admitted to hospital. Pt has since self-extubated and was seen at bedside this am.  Pt states she is doing ok. Pt was tearful and felt regretful about suicide attempt as she "had so many people that care about her and love her". Pt denies present SI/HI/AVH. Pt agreeable to inpatient psych hospitalization once pt is medically cleared.   Past Psychiatric History: n/a  Risk to Self:   yes Risk to Others:   no Prior Inpatient Therapy:   no Prior Outpatient Therapy:   no  Past Medical History: No past medical history on file. No past surgical history on file. Family History: No family history on file. Family Psychiatric  History: n/a Social History:  Social History   Substance and Sexual Activity  Alcohol Use None     Social History   Substance and Sexual Activity  Drug Use Not on file    Social History   Socioeconomic History   Marital status: Single    Spouse name: Not on file   Number of children: Not on file    Years of education: Not on file   Highest education level: Not on file  Occupational History   Not on file  Tobacco Use   Smoking status: Not on file   Smokeless tobacco: Not on file  Substance and Sexual Activity   Alcohol use: Not on file   Drug use: Not on file   Sexual activity: Not on file  Other Topics Concern   Not on file  Social History Narrative   Not on file   Social Determinants of Health   Financial Resource Strain: Not on file  Food Insecurity: Not on file  Transportation Needs: Not on file  Physical Activity: Not on file  Stress: Not on file  Social Connections: Not on file   Additional Social History:    Allergies:  No Known Allergies  Labs:  Results for orders placed or performed during the hospital encounter of 04/03/21 (from the past 48 hour(s))  CBG monitoring, ED     Status: Abnormal   Collection Time: 04/03/21  2:56 PM  Result Value Ref Range   Glucose-Capillary 128 (H) 70 - 99 mg/dL    Comment: Glucose reference range applies only to samples taken after fasting for at least 8 hours.  CBC with Differential     Status: Abnormal   Collection Time: 04/03/21  3:06 PM  Result Value Ref Range   WBC 7.5 4.0 - 10.5 K/uL   RBC 5.01 3.87 - 5.11 MIL/uL  Hemoglobin 12.4 12.0 - 15.0 g/dL   HCT 16.1 09.6 - 04.5 %   MCV 85.0 80.0 - 100.0 fL   MCH 24.8 (L) 26.0 - 34.0 pg   MCHC 29.1 (L) 30.0 - 36.0 g/dL   RDW 40.9 (H) 81.1 - 91.4 %   Platelets 277 150 - 400 K/uL    Comment: REPEATED TO VERIFY   nRBC 0.0 0.0 - 0.2 %   Neutrophils Relative % 70 %   Neutro Abs 5.2 1.7 - 7.7 K/uL   Lymphocytes Relative 20 %   Lymphs Abs 1.5 0.7 - 4.0 K/uL   Monocytes Relative 9 %   Monocytes Absolute 0.7 0.1 - 1.0 K/uL   Eosinophils Relative 0 %   Eosinophils Absolute 0.0 0.0 - 0.5 K/uL   Basophils Relative 1 %   Basophils Absolute 0.1 0.0 - 0.1 K/uL   Immature Granulocytes 0 %   Abs Immature Granulocytes 0.03 0.00 - 0.07 K/uL    Comment: Performed at [facility], [address] [place], [place] [address]  Comprehensive metabolic panel     Status: Abnormal   Collection Time: 04/03/21  3:06 PM  Result Value Ref Range   Sodium 140 135 - 145 mmol/L   Potassium 3.6 3.5 - 5.1 mmol/L   Chloride 107 98 - 111 mmol/L   CO2 13 (L) 22 - 32 mmol/L   Glucose, Bld 134 (H) 70 - 99 mg/dL    Comment: Glucose reference range applies only to samples taken after fasting for at least 8 hours.   BUN 12 6 - 20 mg/dL   Creatinine, Ser 6.21 (H) 0.44 - 1.00 mg/dL   Calcium 9.0 8.9 - 30.8 mg/dL   Total Protein 6.8 6.5 - 8.1 g/dL   Albumin 3.7 3.5 - 5.0 g/dL   AST 28 15 - 41 U/L   ALT 18 0 - 44 U/L   Alkaline Phosphatase 41 38 - 126 U/L   Total Bilirubin 0.2 (L) 0.3 - 1.2 mg/dL   GFR, Estimated >65 >78 mL/min    Comment: (NOTE) Calculated using the CKD-EPI Creatinine Equation (2021)    Anion gap 20 (H) 5 - 15    Comment: Performed at [facility], [address] [place], [place] [address]  Troponin I (High Sensitivity)     Status: None   Collection Time: 04/03/21  3:06 PM  Result Value Ref Range   Troponin I (High Sensitivity) 10 <18 ng/L    Comment: (NOTE) Elevated high sensitivity troponin I (hsTnI) values and significant  changes across serial measurements may suggest ACS but many other  chronic and acute conditions are known to elevate hsTnI results.  Refer to the "Links" section for chest pain algorithms and additional  guidance. Performed at [facility], [address] [place], [place] [address]   Salicylate level     Status: Abnormal   Collection Time: 04/03/21  3:10 PM  Result Value Ref Range   Salicylate Lvl <7.0 (L) 7.0 - 30.0 mg/dL    Comment: Performed at [facility], [address], [place], [place] [address]  Acetaminophen level     Status: Abnormal   Collection Time: 04/03/21  3:10 PM  Result Value Ref Range   Acetaminophen (Tylenol), Serum 110 (H) 10 - 30 ug/mL    Comment: (NOTE) Therapeutic concentrations  vary significantly. A range of 10-30 ug/mL  may be an effective concentration for many patients. However, some  are best treated  at concentrations outside of this range. Acetaminophen concentrations >150 ug/mL at 4 hours after ingestion  and >50 ug/mL at 12 hours after ingestion are often associated with  toxic reactions.  Performed at [facility], [address], [place], [place] [address]   Ethanol     Status: None   Collection Time: 04/03/21  3:10 PM  Result Value Ref Range   Alcohol, Ethyl (B) <10 <10 mg/dL    Comment: (NOTE) Lowest detectable limit for serum alcohol is 10 mg/dL.  For medical purposes only. Performed at [facility], [address] [place], [place] [address]   HIV Antibody (routine testing w rflx)     Status: None   Collection Time: 04/03/21  3:10 PM  Result Value Ref Range   HIV Screen 4th Generation wRfx Non Reactive Non Reactive    Comment: Performed at [facility], [address], [place], [place] [address]  Magnesium     Status: None   Collection Time: 04/03/21  3:10 PM  Result Value Ref Range   Magnesium 2.2 1.7 - 2.4 mg/dL    Comment: Performed at [facility], [address], [place], [place] [address]  Phosphorus     Status: None   Collection Time: 04/03/21  3:10 PM  Result Value Ref Range   Phosphorus 3.1 2.5 - 4.6 mg/dL    Comment: Performed at [facility], [address] [place], [place] [address]  Rapid urine drug screen (hospital performed)     Status: None   Collection Time: 04/03/21  3:28 PM  Result Value Ref Range   Opiates NONE DETECTED NONE DETECTED   Cocaine NONE DETECTED NONE DETECTED   Benzodiazepines NONE DETECTED NONE DETECTED   Amphetamines NONE DETECTED NONE DETECTED   Tetrahydrocannabinol NONE DETECTED NONE DETECTED   Barbiturates NONE DETECTED NONE DETECTED    Comment: (NOTE) DRUG SCREEN FOR MEDICAL PURPOSES ONLY.  IF CONFIRMATION IS NEEDED FOR ANY PURPOSE, NOTIFY LAB WITHIN 5  DAYS.  LOWEST DETECTABLE LIMITS FOR URINE DRUG SCREEN Drug Class                     Cutoff (ng/mL) Amphetamine and metabolites    1000 Barbiturate and metabolites    200 Benzodiazepine                 200 Tricyclics and metabolites     300 Opiates and metabolites        300 Cocaine and metabolites        300 THC                            50 Performed at [facility], [address] [place], [place] [address]   I-Stat arterial blood gas, ED     Status: Abnormal   Collection Time: 04/03/21  3:44 PM  Result Value Ref Range   pH, Arterial 7.246 (L) 7.350 - 7.450   pCO2 arterial 37.5 32.0 - 48.0 mmHg   pO2, Arterial 540 (H) 83.0 - 108.0 mmHg   Bicarbonate 16.3 (L) 20.0 - 28.0 mmol/L   TCO2 17 (L) 22 - 32 mmol/L   O2 Saturation 100.0 %   Acid-base deficit 10.0 (H) 0.0 - 2.0 mmol/L   Sodium 139 135 - 145 mmol/L   Potassium 3.8 3.5 - 5.1 mmol/L   Calcium, Ion 1.12 (L) 1.15 - 1.40 mmol/L   HCT  39.0 36.0 - 46.0 %   Hemoglobin 13.3 12.0 - 15.0 g/dL   Sample type ARTERIAL   Troponin I (High Sensitivity)     Status: None   Collection Time: 04/03/21  5:06 PM  Result Value Ref Range   Troponin I (High Sensitivity) 17 <18 ng/L    Comment: (NOTE) Elevated high sensitivity troponin I (hsTnI) values and significant  changes across serial measurements may suggest ACS but many other  chronic and acute conditions are known to elevate hsTnI results.  Refer to the "Links" section for chest pain algorithms and additional  guidance. Performed at [facility], [address] [place], [place] [address]   Osmolality     Status: Abnormal   Collection Time: 04/03/21  5:06 PM  Result Value Ref Range   Osmolality 322 (HH) 275 - 295 mOsm/kg    Comment: CRITICAL RESULT CALLED TO, READ BACK BY AND VERIFIED WITH: Eliot Ford RN [phone] K FORSYTH Performed at [facility], [address], [place], [place] [address]   Volatiles,Blood (acetone,ethanol,isoprop,methanol)     Status:  None   Collection Time: 04/03/21  5:06 PM  Result Value Ref Range   Acetone, blood <0.010 0.000 - 0.010 g/dL    Comment: (NOTE)                                Detection Limit = 0.010 A courtesy copy of this report has been sent to [facility], [phone] Performed At: [facility] [address] [place], [place] [id number] Jolene Schimke MD [id number]    Ethanol, blood <0.010 0.000 - 0.010 g/dL    Comment: (NOTE) Reported patient Montina Dorrance at [facility] as                                Detection Limit = 0.010 This test was developed and its performance characteristics determined by [facility]. It has not been cleared or approved by the Food and Drug Administration.    Isopropanol, blood <0.010 0.000 - 0.010 g/dL    Comment:                                 Detection Limit = 0.010   Methanol, blood <0.010 0.000 - 0.010 g/dL    Comment:                                 Detection Limit = 0.010  Sodium, urine, random     Status: None   Collection Time: 04/03/21  5:08 PM  Result Value Ref Range   Sodium, Ur 84 mmol/L    Comment: Performed at [facility], [address] [place], [place] [address]  Ammonia     Status: Abnormal   Collection Time: 04/03/21  6:27 PM  Result Value Ref Range   Ammonia 37 (H) 9 - 35 umol/L    Comment: Performed at [facility], [address], [place], [place] [address]  Basic metabolic panel     Status: Abnormal   Collection Time: 04/03/21  6:30 PM  Result Value Ref Range   Sodium 140 135 - 145 mmol/L   Potassium 4.5 3.5 - 5.1 mmol/L   Chloride  111 98 - 111 mmol/L   CO2 14 (L) 22 - 32 mmol/L   Glucose, Bld 171 (H) 70 - 99 mg/dL    Comment: Glucose reference range applies only to samples taken after fasting for at least 8 hours.   BUN 9 6 - 20 mg/dL   Creatinine, Ser 1.61 (H) 0.44 - 1.00 mg/dL   Calcium 8.9 8.9 - 09.6 mg/dL   GFR, Estimated >04 >54 mL/min    Comment: (NOTE) Calculated using the  CKD-EPI Creatinine Equation (2021)    Anion gap 15 5 - 15    Comment: Performed at [facility], [address] [place], [place] [address]  Protime-INR     Status: Abnormal   Collection Time: 04/03/21  6:30 PM  Result Value Ref Range   Prothrombin Time 16.5 (H) 11.4 - 15.2 seconds   INR 1.3 (H) 0.8 - 1.2    Comment: (NOTE) INR goal varies based on device and disease states. Performed at [facility], [address], [place], [place] [address]   Hepatic function panel     Status: None   Collection Time: 04/03/21  6:30 PM  Result Value Ref Range   Total Protein 7.6 6.5 - 8.1 g/dL   Albumin 4.0 3.5 - 5.0 g/dL   AST 25 15 - 41 U/L   ALT 22 0 - 44 U/L   Alkaline Phosphatase 42 38 - 126 U/L   Total Bilirubin 0.6 0.3 - 1.2 mg/dL   Bilirubin, Direct <2.9 0.0 - 0.2 mg/dL   Indirect Bilirubin NOT CALCULATED 0.3 - 0.9 mg/dL    Comment: Performed at [facility], [address] [place], [place] [address]  MRSA Next Gen by PCR, Nasal     Status: None   Collection Time: 04/03/21  7:05 PM   Specimen: Nasal Mucosa; Nasal Swab  Result Value Ref Range   MRSA by PCR Next Gen NOT DETECTED NOT DETECTED    Comment: (NOTE) The GeneXpert MRSA Assay (FDA approved for NASAL specimens only), is one component of a comprehensive MRSA colonization surveillance program. It is not intended to diagnose MRSA infection nor to guide or monitor treatment for MRSA infections. Test performance is not FDA approved in patients less than 50 years old. Performed at [facility], [address], [place], [place] [address]   Glucose, capillary     Status: Abnormal   Collection Time: 04/03/21  7:08 PM  Result Value Ref Range   Glucose-Capillary 170 (H) 70 - 99 mg/dL    Comment: Glucose reference range applies only to samples taken after fasting for at least 8 hours.  Osmolality, urine     Status: None   Collection Time: 04/03/21  7:35 PM  Result Value Ref Range   Osmolality, Ur 372 300 -  900 mOsm/kg    Comment: Performed at [facility], [address] [place], [place] [address]  I-STAT 7, (LYTES, BLD GAS, ICA, H+H)     Status: Abnormal   Collection Time: 04/03/21  8:46 PM  Result Value Ref Range   pH, Arterial 7.274 (L) 7.350 - 7.450   pCO2 arterial 28.2 (L) 32.0 - 48.0 mmHg   pO2, Arterial 149 (H) 83.0 - 108.0 mmHg   Bicarbonate 13.1 (L) 20.0 - 28.0 mmol/L   TCO2 14 (L) 22 - 32 mmol/L   O2 Saturation 99.0 %   Acid-base deficit 12.0 (H) 0.0 - 2.0 mmol/L   Sodium 144 135 -  145 mmol/L   Potassium 4.1 3.5 - 5.1 mmol/L   Calcium, Ion 1.25 1.15 - 1.40 mmol/L   HCT 43.0 36.0 - 46.0 %   Hemoglobin 14.6 12.0 - 15.0 g/dL   Patient temperature 99.8 F    Collection site Radial    Drawn by RT    Sample type ARTERIAL   Acetaminophen level     Status: Abnormal   Collection Time: 04/03/21  9:14 PM  Result Value Ref Range   Acetaminophen (Tylenol), Serum 67 (H) 10 - 30 ug/mL    Comment: (NOTE) Therapeutic concentrations vary significantly. A range of 10-30 ug/mL  may be an effective concentration for many patients. However, some  are best treated at concentrations outside of this range. Acetaminophen concentrations >150 ug/mL at 4 hours after ingestion  and >50 ug/mL at 12 hours after ingestion are often associated with  toxic reactions.  Performed at [facility], [address], [place], [place] [address]   Glucose, capillary     Status: Abnormal   Collection Time: 04/03/21 11:05 PM  Result Value Ref Range   Glucose-Capillary 133 (H) 70 - 99 mg/dL    Comment: Glucose reference range applies only to samples taken after fasting for at least 8 hours.  Urinalysis, Routine w reflex microscopic     Status: Abnormal   Collection Time: 04/04/21  2:51 AM  Result Value Ref Range   Color, Urine YELLOW YELLOW   APPearance CLEAR CLEAR   Specific Gravity, Urine 1.018 1.005 - 1.030   pH 5.0 5.0 - 8.0   Glucose, UA NEGATIVE NEGATIVE mg/dL   Hgb urine dipstick  NEGATIVE NEGATIVE   Bilirubin Urine NEGATIVE NEGATIVE   Ketones, ur 80 (A) NEGATIVE mg/dL   Protein, ur 30 (A) NEGATIVE mg/dL   Nitrite NEGATIVE NEGATIVE   Leukocytes,Ua NEGATIVE NEGATIVE   RBC / HPF 0-5 0 - 5 RBC/hpf   WBC, UA 11-20 0 - 5 WBC/hpf   Bacteria, UA NONE SEEN NONE SEEN   Mucus PRESENT     Comment: Performed at [facility], [address] [place], [place] [address]  Protime-INR     Status: Abnormal   Collection Time: 04/04/21  2:51 AM  Result Value Ref Range   Prothrombin Time 16.7 (H) 11.4 - 15.2 seconds   INR 1.4 (H) 0.8 - 1.2    Comment: (NOTE) INR goal varies based on device and disease states. Performed at [facility], [address] [place], [place] [address]   Acetaminophen level     Status: Abnormal   Collection Time: 04/04/21  2:51 AM  Result Value Ref Range   Acetaminophen (Tylenol), Serum 39 (H) 10 - 30 ug/mL    Comment: (NOTE) Therapeutic concentrations vary significantly. A range of 10-30 ug/mL  may be an effective concentration for many patients. However, some  are best treated at concentrations outside of this range. Acetaminophen concentrations >150 ug/mL at 4 hours after ingestion  and >50 ug/mL at 12 hours after ingestion are often associated with  toxic reactions.  Performed at [facility], [address], [place], [place] [address]   CBC     Status: Abnormal   Collection Time: 04/04/21  2:51 AM  Result Value Ref Range   WBC 6.0 4.0 - 10.5 K/uL   RBC 4.99 3.87 - 5.11 MIL/uL   Hemoglobin 12.4 12.0 - 15.0 g/dL   HCT 24.0 97.3 - 53.2 %   MCV 80.6 80.0 - 100.0  fL   MCH 24.8 (L) 26.0 - 34.0 pg   MCHC 30.8 30.0 - 36.0 g/dL   RDW 53.6 (H) 46.8 - 03.2 %   Platelets 300 150 - 400 K/uL   nRBC 0.0 0.0 - 0.2 %    Comment: Performed at [facility], [address], [place], [place] [address]  Salicylate level     Status: Abnormal   Collection Time: 04/04/21  2:51 AM  Result Value Ref Range   Salicylate Lvl <7.0 (L)  7.0 - 30.0 mg/dL    Comment: Performed at [facility], [address], [place], [place] [address]  Comprehensive metabolic panel     Status: Abnormal   Collection Time: 04/04/21  2:51 AM  Result Value Ref Range   Sodium 142 135 - 145 mmol/L   Potassium 3.7 3.5 - 5.1 mmol/L   Chloride 111 98 - 111 mmol/L   CO2 18 (L) 22 - 32 mmol/L   Glucose, Bld 144 (H) 70 - 99 mg/dL    Comment: Glucose reference range applies only to samples taken after fasting for at least 8 hours.   BUN 8 6 - 20 mg/dL   Creatinine, Ser 7.04 (H) 0.44 - 1.00 mg/dL   Calcium 8.8 (L) 8.9 - 10.3 mg/dL   Total Protein 6.3 (L) 6.5 - 8.1 g/dL   Albumin 3.3 (L) 3.5 - 5.0 g/dL   AST 22 15 - 41 U/L   ALT 21 0 - 44 U/L   Alkaline Phosphatase 38 38 - 126 U/L   Total Bilirubin 0.7 0.3 - 1.2 mg/dL   GFR, Estimated 57 (L) >60 mL/min    Comment: (NOTE) Calculated using the CKD-EPI Creatinine Equation (2021)    Anion gap 13 5 - 15    Comment: Performed at [facility], [address] [place], [place] [address]  Glucose, capillary     Status: Abnormal   Collection Time: 04/04/21  3:06 AM  Result Value Ref Range   Glucose-Capillary 131 (H) 70 - 99 mg/dL    Comment: Glucose reference range applies only to samples taken after fasting for at least 8 hours.  Comprehensive metabolic panel     Status: Abnormal   Collection Time: 04/04/21  7:00 AM  Result Value Ref Range   Sodium 142 135 - 145 mmol/L   Potassium 2.6 (LL) 3.5 - 5.1 mmol/L    Comment: CRITICAL RESULT CALLED TO, READ BACK BY AND VERIFIED WITH: L.WILSON,RN 0830 04/04/21 CLARK,S CORRECTED ON 07/27 AT 0954: PREVIOUSLY REPORTED AS 2.6 CRITICAL RESULT CALLED TO, READ BACK BY AND VERIFIED WITH: L.WILSON,RN 0830 04/04/24 CLARK,S    Chloride 108 98 - 111 mmol/L   CO2 20 (L) 22 - 32 mmol/L   Glucose, Bld 150 (H) 70 - 99 mg/dL    Comment: Glucose reference range applies only to samples taken after fasting for at least 8 hours.   BUN 7 6 - 20 mg/dL   Creatinine,  Ser 6.94 (H) 0.44 - 1.00 mg/dL   Calcium 8.0 (L) 8.9 - 10.3 mg/dL   Total Protein 5.8 (L) 6.5 - 8.1 g/dL   Albumin 3.0 (L) 3.5 - 5.0 g/dL   AST 22 15 - 41 U/L   ALT 19 0 - 44 U/L   Alkaline Phosphatase 36 (L) 38 - 126 U/L   Total Bilirubin 0.6 0.3 - 1.2 mg/dL   GFR, Estimated 52 (L) >60 mL/min    Comment: (NOTE) Calculated using the CKD-EPI Creatinine Equation (  2021)    Anion gap 14 5 - 15    Comment: Performed at [facility], [address] [place], [place] [address]  Glucose, capillary     Status: Abnormal   Collection Time: 04/04/21  7:39 AM  Result Value Ref Range   Glucose-Capillary 142 (H) 70 - 99 mg/dL    Comment: Glucose reference range applies only to samples taken after fasting for at least 8 hours.  Glucose, capillary     Status: Abnormal   Collection Time: 04/04/21 12:27 PM  Result Value Ref Range   Glucose-Capillary 117 (H) 70 - 99 mg/dL    Comment: Glucose reference range applies only to samples taken after fasting for at least 8 hours.  Glucose, capillary     Status: Abnormal   Collection Time: 04/04/21  3:07 PM  Result Value Ref Range   Glucose-Capillary 105 (H) 70 - 99 mg/dL    Comment: Glucose reference range applies only to samples taken after fasting for at least 8 hours.  Acetaminophen level     Status: None   Collection Time: 04/04/21  3:16 PM  Result Value Ref Range   Acetaminophen (Tylenol), Serum 11 10 - 30 ug/mL    Comment: (NOTE) Therapeutic concentrations vary significantly. A range of 10-30 ug/mL  may be an effective concentration for many patients. However, some  are best treated at concentrations outside of this range. Acetaminophen concentrations >150 ug/mL at 4 hours after ingestion  and >50 ug/mL at 12 hours after ingestion are often associated with  toxic reactions.  Performed at [facility], [address] [place], [place] [address]   Comprehensive metabolic panel     Status: Abnormal   Collection Time: 04/04/21  3:16 PM   Result Value Ref Range   Sodium 141 135 - 145 mmol/L   Potassium 3.3 (L) 3.5 - 5.1 mmol/L    Comment: NO VISIBLE HEMOLYSIS   Chloride 107 98 - 111 mmol/L   CO2 24 22 - 32 mmol/L   Glucose, Bld 113 (H) 70 - 99 mg/dL    Comment: Glucose reference range applies only to samples taken after fasting for at least 8 hours.   BUN 9 6 - 20 mg/dL   Creatinine, Ser 0.981.85 (H) 0.44 - 1.00 mg/dL   Calcium 8.0 (L) 8.9 - 10.3 mg/dL   Total Protein 5.9 (L) 6.5 - 8.1 g/dL   Albumin 3.1 (L) 3.5 - 5.0 g/dL   AST 26 15 - 41 U/L   ALT 22 0 - 44 U/L   Alkaline Phosphatase 35 (L) 38 - 126 U/L   Total Bilirubin 1.8 (H) 0.3 - 1.2 mg/dL   GFR, Estimated 40 (L) >60 mL/min    Comment: (NOTE) Calculated using the CKD-EPI Creatinine Equation (2021)    Anion gap 10 5 - 15    Comment: Performed at [facility], [address] [place], [place] [address]  Protime-INR     Status: Abnormal   Collection Time: 04/04/21  3:16 PM  Result Value Ref Range   Prothrombin Time 18.4 (H) 11.4 - 15.2 seconds   INR 1.5 (H) 0.8 - 1.2    Comment: (NOTE) INR goal varies based on device and disease states. Performed at [facility], [address] [place], [place] [address]     Current Facility-Administered Medications  Medication Dose Route Frequency Provider Last Rate Last Admin   0.9 %  sodium chloride infusion   Intravenous PRN McQuaid,  Brooke Pace, MD 10 mL/hr at 04/04/21 1600 Infusion Verify at 04/04/21 1600   acetylcysteine (ACETADOTE) 24,000 mg in dextrose 5 % 600 mL (40 mg/mL) infusion  15 mg/kg/hr Intravenous Continuous Cathie Hoops, RPH 34.7 mL/hr at 04/04/21 1831 15 mg/kg/hr at 04/04/21 1831   Chlorhexidine Gluconate Cloth 2 % PADS 6 each  6 each Topical Q0600 Lupita Leash, MD   6 each at 04/04/21 1000   docusate (COLACE) 50 MG/5ML liquid 100 mg  100 mg Per Tube BID PRN Trudee Grip, RPH       heparin injection 5,000 Units  5,000 Units Subcutaneous Q8H Selmer Dominion B, NP   5,000 Units at  04/04/21 1342   lactated ringers infusion   Intravenous Continuous Max Fickle B, MD 125 mL/hr at 04/04/21 1600 Infusion Verify at 04/04/21 1600   polyethylene glycol (MIRALAX / GLYCOLAX) packet 17 g  17 g Per Tube Daily PRN Trudee Grip, RPH       potassium chloride 10 mEq in 50 mL *CENTRAL LINE* IVPB  10 mEq Intravenous Q1 Hr x 2 Trudee Grip, RPH 50 mL/hr at 04/04/21 1812 10 mEq at 04/04/21 1812   sodium chloride flush (NS) 0.9 % injection 10-40 mL  10-40 mL Intracatheter Q12H Max Fickle B, MD   10 mL at 04/04/21 1142   sodium chloride flush (NS) 0.9 % injection 10-40 mL  10-40 mL Intracatheter PRN Lupita Leash, MD          Psychiatric Specialty Exam:  Presentation  General Appearance:  Appropriate for Environment; Well Groomed Eye Contact: Minimal Speech: Slow Speech Volume: Decreased  Mood and Affect  Mood: Depressed Affect: Depressed  Thought Process  Thought Processes: Goal Directed; Linear Descriptions of Associations:Intact Orientation:Full (Time, Place and Person) Thought Content:Logical History of Schizophrenia/Schizoaffective disorder:No data recorded Duration of Psychotic Symptoms:No data recorded Hallucinations:Hallucinations: None Ideas of Reference:None Suicidal Thoughts:Suicidal Thoughts: No Homicidal Thoughts:Homicidal Thoughts: No  Sensorium  Memory: Immediate Fair; Recent Fair; Remote Fair Judgment: Poor Insight: Poor  Executive Functions  Concentration: Fair Attention Span: Fair Recall: [employer] of Knowledge: Fair Language: Fair  Psychomotor Activity  Psychomotor Activity: Psychomotor Activity: Normal  Assets  Assets: Social Support; Desire for Improvement; Communication Skills; Housing  Sleep  Sleep: Sleep: Fair  Physical Exam: Physical Exam Vitals reviewed.  Constitutional:      Appearance: Normal appearance.  HENT:     Head: Normocephalic and atraumatic.  Eyes:     Extraocular Movements:  Extraocular movements intact.  Cardiovascular:     Rate and Rhythm: Normal rate and regular rhythm.  Pulmonary:     Effort: Pulmonary effort is normal.     Breath sounds: Normal breath sounds.  Abdominal:     General: Abdomen is flat.     Palpations: Abdomen is soft.  Musculoskeletal:        General: Normal range of motion.     Cervical back: Normal range of motion.  Skin:    General: Skin is warm and dry.  Neurological:     General: No focal deficit present.     Mental Status: She is alert and oriented to person, place, and time.   Review of Systems  Constitutional:  Negative for chills and fever.  Respiratory:  Negative for cough, shortness of breath and wheezing.   Cardiovascular:  Negative for chest pain and palpitations.  Gastrointestinal:  Negative for abdominal pain, nausea and vomiting.  Skin:  Negative for itching and rash.  Neurological:  Negative  for dizziness and headaches.  Blood pressure (!) 112/56, pulse 99, temperature 97.8 F (36.6 C), temperature source Oral, resp. rate (!) 21, height  (1.702 m), weight 93.8 kg, SpO2 100 %. Body mass index is 32.39 kg/m.  Treatment Plan Summary: Pt is a 20 yo female w/ hx of acne vulgaris treated with isotretinoin admitted due to tylenol and ibuprofen overdose. Pt is now alert and oriented. Pt will greatly benefit from inpatient psychiatric hospitalization. Will also evaluate if pt will need to d/c isotretinoin as it is known to increase depression and suicidality.   MDD vs substance induced mood disorder -recommend inpatient psychiatric hospitalization once medically stable -no psychiatric medications at this time as patient is still stabilizing from overdose  Disposition: Recommend psychiatric Inpatient admission when medically cleared. Supportive therapy provided about ongoing stressors.  Thank you for the consult Park Pope, MD PGY1 Psychiatry Resident 04/04/2021 6:36 PM

## 2021-04-04 NOTE — Progress Notes (Addendum)
NAME:  Elizabeth Brandt, MRN:  [id number], DOB:  25-Jan-2001, LOS: 1 ADMISSION DATE:  04/03/2021, CONSULTATION DATE:  7/26 REFERRING MD:  Audley Hose, CHIEF COMPLAINT:  overdose   History of Present Illness:  20 y/o female admitted after intentional overdose of acetaminophen and ibuprofen.  Pertinent  Medical History  none  Significant Hospital Events: Including procedures, antibiotic start and stop dates in addition to other pertinent events   7/26 admission, intubation, NAC therapy 7/27 self extubation  Interim History / Subjective:  Self extubated Says she took the entire bottle of tylenol and about 80 tablets of ibuprofen  Objective   Blood pressure 104/67, pulse (!) 112, temperature 99.8 F (37.7 C), temperature source Oral, resp. rate 17, height 5\' 7"  (1.702 m), weight 93.8 kg, SpO2 97 %.    Vent Mode: PRVC FiO2 (%):  [40 %-100 %] 40 % Set Rate:  [18 bmp-20 bmp] 20 bmp Vt Set:  [500 mL] 500 mL PEEP:  [5 cmH20] 5 cmH20 Plateau Pressure:  [15 cmH20-19 cmH20] 19 cmH20   Intake/Output Summary (Last 24 hours) at 04/04/2021 0855 Last data filed at 04/04/2021 0700 Gross per 24 hour  Intake 3131.1 ml  Output 4000 ml  Net -868.9 ml   Filed Weights   04/03/21 1507 04/04/21 0500  Weight: 92.5 kg 93.8 kg    Examination:  General:  Resting comfortably in bed HENT: NCAT OP clear PULM: CTA B, normal effort CV: RRR, no mgr GI: BS+, soft, nontender MSK: normal bulk and tone Neuro: awake, alert, no distress, MAEW  Resolved Hospital Problem list     Assessment & Plan:  Suicide attempt by intentional overdose of acetaminophen and ibuprofen: Suicide sitter Psychiatry evaluation  Acetaminophen overdose: This morning INR is slightly elevated but transaminases are OK; appears that she may do OK wihtout lasting liver damage > continue to monitor LFT and APAP level > continue n-acetylcysteine per protocol  Reported overdose of ibuprofen: unclear why the salicylate level is  negative; the rest of her labs are suggestive of ibuprofen overdose (increased anion gap metabolic acidosis); perhaps the charcoal cleared this? > continue serum bicarb infusion for now > f/u recommendations from poison control  Hypokalemia > replace now > monitor  Acute respiratory failure with hypoxemia: self extubated and doing well > monitor respiratory status  AKI > continue fluid administration with sodium bicarbonate  Best Practice (right click and "Reselect all SmartList Selections" daily)   Diet/type: Regular consistency (see orders) DVT prophylaxis: prophylactic heparin  GI prophylaxis: PPI Lines: Central line remove Foley:  N/A Code Status:  full code Last date of multidisciplinary goals of care discussion [7/26]  Labs   CBC: Recent Labs  Lab 04/03/21 1506 04/03/21 1544 04/03/21 2046 04/04/21 0251  WBC 7.5  --   --  6.0  NEUTROABS 5.2  --   --   --   HGB 12.4 13.3 14.6 12.4  HCT 42.6 39.0 43.0 40.2  MCV 85.0  --   --  80.6  PLT 277  --   --  300    Basic Metabolic Panel: Recent Labs  Lab 04/03/21 1506 04/03/21 1510 04/03/21 1544 04/03/21 1830 04/03/21 2046 04/04/21 0251 04/04/21 0700  NA 140  --  139 140 144 142 142  K 3.6  --  3.8 4.5 4.1 3.7 2.6*  CL 107  --   --  111  --  111 108  CO2 13*  --   --  14*  --  18* 20*  GLUCOSE  134*  --   --  171*  --  144* 150*  BUN 12  --   --  9  --  8 7  CREATININE 1.12*  --   --  1.15*  --  1.37* 1.47*  CALCIUM 9.0  --   --  8.9  --  8.8* 8.0*  MG  --  2.2  --   --   --   --   --   PHOS  --  3.1  --   --   --   --   --    GFR: Estimated Creatinine Clearance: 71.8 mL/min (A) (by C-G formula based on SCr of 1.47 mg/dL (H)). Recent Labs  Lab 04/03/21 1506 04/04/21 0251  WBC 7.5 6.0    Liver Function Tests: Recent Labs  Lab 04/03/21 1506 04/03/21 1830 04/04/21 0251 04/04/21 0700  AST 28 25 22 22   ALT 18 22 21 19   ALKPHOS 41 42 38 36*  BILITOT 0.2* 0.6 0.7 0.6  PROT 6.8 7.6 6.3* 5.8*  ALBUMIN  3.7 4.0 3.3* 3.0*   No results for input(s): LIPASE, AMYLASE in the last 168 hours. Recent Labs  Lab 04/03/21 1827  AMMONIA 37*    ABG    Component Value Date/Time   PHART 7.274 (L) 04/03/2021 2046   PCO2ART 28.2 (L) 04/03/2021 2046   PO2ART 149 (H) 04/03/2021 2046   HCO3 13.1 (L) 04/03/2021 2046   TCO2 14 (L) 04/03/2021 2046   ACIDBASEDEF 12.0 (H) 04/03/2021 2046   O2SAT 99.0 04/03/2021 2046     Coagulation Profile: Recent Labs  Lab 04/03/21 1830 04/04/21 0251  INR 1.3* 1.4*    Cardiac Enzymes: No results for input(s): CKTOTAL, CKMB, CKMBINDEX, TROPONINI in the last 168 hours.  HbA1C: No results found for: HGBA1C  CBG: Recent Labs  Lab 04/03/21 1456 04/03/21 1908 04/03/21 2305 04/04/21 0306 04/04/21 0739  GLUCAP 128* 170* 133* 131* 142*    Critical care time: 31  minutes    04/06/21, MD [facility] PCCM Pager: [phone] Cell: [phone] After 7:00 pm call Elink  [phone]

## 2021-04-04 NOTE — Progress Notes (Signed)
Poison Control Consult Documentation  [place] [facility] has been contacted by request of NP Selmer Dominion due to the patient's intentional overdose on acetaminophen and duexis (ibuprofen/famotidine). Patient LKW at 0900 7/26 when mom returned home today (7/26) at 1400 patient was unresponsive and empty APAP and duexis bottle found near her - Pt also believed to have ingested some HEMP/CBD lotion as well.  Labs: Tylenol level 110>67>39 Salicylate undetectable (<7) Qtc 549 in setting of hypokalemia (2.6)  Summary  of [place] [facility] Control Recommendations: Spoke with Denice at [facility].  Treatment Recommendations: (see [facility] NAC Fact sheet below signature line of this note) -Continue acetadote -Recheck tylenol level and LFTs this afternoon (7/27) @1400  -Stop sodium bicarbonate and start maintenance fluids -Optimize potassium and magnesium -Avoid qt/qtc prolonging medications  Recommendations provided to requesting physician.  , PharmD PGY1 Pharmacy Resident 04/04/2021  10:29 AM  Please check [email or web address] for unit-specific pharmacy phone numbers.

## 2021-04-04 NOTE — Progress Notes (Signed)
RN called RT to bedside due to patient self-extubating. Upon arrival patient was vomiting and ETT was out. RT and RN assessed patient and patient vital signs stable and can speak. No stridor noted at this time. Patient states that her breathing is comfortable and no distress noted at this time. RT placed 4L [place] on patient. RT will continue to monitor as needed.

## 2021-04-04 NOTE — Progress Notes (Signed)
   04/04/21 1425  Clinical Encounter Type  Visited With Patient  Visit Type Spiritual support  Referral From Nurse  Consult/Referral To Chaplain   Chaplain responded to consult. The patient stated her mother requested prayer for her. Chaplain asked open ended questions to encourage sharing of feelings. The patient engaged in self-reflection. Patient asked for prayer to have the will to live because she knows her family loves her.  She is the youngest of five children. I prayed for healing and peace.  Advised her chaplains remain available. This note was prepared by Deneen Harts, M.Div..  For questions please contact by phone [phone].

## 2021-04-05 DIAGNOSIS — J9601 Acute respiratory failure with hypoxia: Secondary | ICD-10-CM

## 2021-04-05 DIAGNOSIS — N179 Acute kidney failure, unspecified: Secondary | ICD-10-CM | POA: Diagnosis not present

## 2021-04-05 DIAGNOSIS — T50902A Poisoning by unspecified drugs, medicaments and biological substances, intentional self-harm, initial encounter: Secondary | ICD-10-CM | POA: Diagnosis not present

## 2021-04-05 DIAGNOSIS — T391X2A Poisoning by 4-Aminophenol derivatives, intentional self-harm, initial encounter: Secondary | ICD-10-CM | POA: Diagnosis not present

## 2021-04-05 LAB — COMPREHENSIVE METABOLIC PANEL
ALT: 17 U/L (ref 0–44)
ALT: 19 U/L (ref 0–44)
AST: 22 U/L (ref 15–41)
AST: 31 U/L (ref 15–41)
Albumin: 2.7 g/dL — ABNORMAL LOW (ref 3.5–5.0)
Albumin: 2.6 g/dL — ABNORMAL LOW (ref 3.5–5.0)
Alkaline Phosphatase: 31 U/L — ABNORMAL LOW (ref 38–126)
Alkaline Phosphatase: 32 U/L — ABNORMAL LOW (ref 38–126)
Anion gap: 8 (ref 5–15)
Anion gap: 9 (ref 5–15)
BUN: 8 mg/dL (ref 6–20)
BUN: 9 mg/dL (ref 6–20)
CO2: 25 mmol/L (ref 22–32)
CO2: 21 mmol/L — ABNORMAL LOW (ref 22–32)
Calcium: 8.2 mg/dL — ABNORMAL LOW (ref 8.9–10.3)
Calcium: 8.5 mg/dL — ABNORMAL LOW (ref 8.9–10.3)
Chloride: 105 mmol/L (ref 98–111)
Chloride: 108 mmol/L (ref 98–111)
Creatinine, Ser: 1.53 mg/dL — ABNORMAL HIGH (ref 0.44–1.00)
Creatinine, Ser: 1.03 mg/dL — ABNORMAL HIGH (ref 0.44–1.00)
GFR, Estimated: 50 mL/min — ABNORMAL LOW (ref >60)
GFR, Estimated: >60 mL/min (ref >60)
Glucose, Bld: 106 mg/dL — ABNORMAL HIGH (ref 70–99)
Glucose, Bld: 96 mg/dL (ref 70–99)
Potassium: 3.1 mmol/L — ABNORMAL LOW (ref 3.5–5.1)
Potassium: 3.6 mmol/L (ref 3.5–5.1)
Sodium: 138 mmol/L (ref 135–145)
Sodium: 138 mmol/L (ref 135–145)
Total Bilirubin: 2.8 mg/dL — ABNORMAL HIGH (ref 0.3–1.2)
Total Bilirubin: 2.8 mg/dL — ABNORMAL HIGH (ref 0.3–1.2)
Total Protein: 5.2 g/dL — ABNORMAL LOW (ref 6.5–8.1)
Total Protein: 5 g/dL — ABNORMAL LOW (ref 6.5–8.1)

## 2021-04-05 LAB — ACETAMINOPHEN LEVEL: Acetaminophen (Tylenol), Serum: <10 ug/mL — ABNORMAL LOW (ref 10–30)

## 2021-04-05 MED ORDER — LEVONORGEST-ETH ESTRAD 91-DAY 0.15-0.03 MG PO TABS: 1.0000 | ORAL_TABLET | Freq: Every day | ORAL | Status: DC

## 2021-04-05 MED ORDER — LEVONORGEST-ETH ESTRAD 91-DAY 0.15-0.03 MG PO TABS
1.0000 | ORAL_TABLET | Freq: Every day | ORAL | Status: DC
  Administered 2021-04-05 – 2021-04-07 (×3): 1 via ORAL
  Filled 2021-04-05 (×2): qty 1

## 2021-04-05 MED ORDER — POTASSIUM CHLORIDE CRYS ER 20 MEQ PO TBCR
40.0000 meq | EXTENDED_RELEASE_TABLET | ORAL | Status: AC
  Administered 2021-04-05 (×2): 40 meq via ORAL
  Filled 2021-04-05 (×2): qty 2

## 2021-04-05 NOTE — Progress Notes (Signed)
Patient ID: Elizabeth Brandt, female   DOB: 11-Apr-2001, 20 y.o.   MRN: [id number]  PROGRESS NOTE    Elizabeth Brandt  WUJ:[id number] DOB: 07-22-2001 DOA: 04/03/2021 PCP: Patient, No Pcp Per (Inactive)   Brief Narrative:  20 year old female with no past medical history was admitted to ICU after intentional overdose of acetaminophen and ibuprofen.  She was obtunded on presentation and was intubated and started on N-acetylcysteine therapy.  She self extubated on 04/04/2021.  She was transferred to [facility] care on 04/05/2021.  Assessment & Plan:   Acute respiratory failure with hypoxia -Possibly from drug overdose.  Intubated on presentation and self extubated on 04/04/2021. -Care has been transferred to [facility] service from 04/05/2021 onwards.  Currently on room air.  Suicide attempt by intentional overdose of acetaminophen and ibuprofen Acute toxic encephalopathy -Patient was obtunded on presentation.  Currently being treated with N-acetylcysteine therapy and per poison control recommendations.  LFTs remain stable. -Psychiatry recommends inpatient psychiatric hospitalization once medically stable.  Patient will probably be medically stable in the next 24 hours once N-acetylcysteine therapy has been completed and LFTs continue to remain stable.  Acute kidney injury -Improving.  Decrease IV fluids to 100 cc an hour.  Hypokalemia -Replace.  Repeat a.m. labs  Obesity -Outpatient follow-up   DVT prophylaxis: Subcutaneous heparin Code Status: Full Family Communication: None at bedside Disposition Plan: Status is: Inpatient  Remains inpatient appropriate because:Inpatient level of care appropriate due to severity of illness  Dispo: The patient is from: Home              Anticipated d/c is to:  [facility] in 1 to 2 days if remains medically stable              Patient currently is not medically stable to d/c.   Difficult to place patient No  Consultants: PCCM/Poison control  Procedures:  None  Antimicrobials: None   Subjective: Patient seen and examined at bedside.  No overnight fever, vomiting or seizures reported.  Oral intake is poor.  Objective: Vitals:   04/05/21 0600 04/05/21 0726 04/05/21 0800 04/05/21 0900  BP: (!) 131/53     Pulse: 94  (!) 101 86  Resp: 19  (!) 25 18  Temp:  99 F (37.2 C)    TempSrc:  Oral    SpO2: 100%  96% 100%  Weight:      Height:        Intake/Output Summary (Last 24 hours) at 04/05/2021 1130 Last data filed at 04/05/2021 1000 Gross per 24 hour  Intake 4097.7 ml  Output 1345 ml  Net 2752.7 ml   Filed Weights   04/03/21 1507 04/04/21 0500 04/05/21 0435  Weight: 92.5 kg 93.8 kg 96.5 kg    Examination:  General exam: Appears calm and comfortable.  Currently on room air. Respiratory system: Bilateral decreased breath sounds at bases Cardiovascular system: S1 & S2 heard, Rate controlled Gastrointestinal system: Abdomen is obese, nondistended, soft and nontender. Normal bowel sounds heard. Extremities: No cyanosis, clubbing, edema  Central nervous system: Alert and oriented. No focal neurological deficits. Moving extremities Skin: No rashes, lesions or ulcers Psychiatry: Flat affect.    Data Reviewed: I have personally reviewed following labs and imaging studies  CBC: Recent Labs  Lab 04/03/21 1506 04/03/21 1544 04/03/21 2046 04/04/21 0251  WBC 7.5  --   --  6.0  NEUTROABS 5.2  --   --   --   HGB 12.4 13.3 14.6 12.4  HCT 42.6 39.0 43.0 40.2  MCV 85.0  --   --  80.6  PLT 277  --   --  300   Basic Metabolic Panel: Recent Labs  Lab 04/03/21 1510 04/03/21 1544 04/03/21 1830 04/03/21 2046 04/04/21 0251 04/04/21 0700 04/04/21 1516 04/05/21 0406  NA  --    < > 140 144 142 142 141 138  K  --    < > 4.5 4.1 3.7 2.6* 3.3* 3.1*  CL  --   --  111  --  111 108 107 105  CO2  --   --  14*  --  18* 20* 24 25  GLUCOSE  --   --  171*  --  144* 150* 113* 106*  BUN  --   --  9  --  8 7 9 8   CREATININE  --   --  1.15*   --  1.37* 1.47* 1.85* 1.53*  CALCIUM  --   --  8.9  --  8.8* 8.0* 8.0* 8.2*  MG 2.2  --   --   --   --   --   --   --   PHOS 3.1  --   --   --   --   --   --   --    < > = values in this interval not displayed.   GFR: Estimated Creatinine Clearance: 70 mL/min (A) (by C-G formula based on SCr of 1.53 mg/dL (H)). Liver Function Tests: Recent Labs  Lab 04/03/21 1830 04/04/21 0251 04/04/21 0700 04/04/21 1516 04/05/21 0406  AST 25 22 22 26 22   ALT 22 21 19 22 17   ALKPHOS 42 38 36* 35* 31*  BILITOT 0.6 0.7 0.6 1.8* 2.8*  PROT 7.6 6.3* 5.8* 5.9* 5.2*  ALBUMIN 4.0 3.3* 3.0* 3.1* 2.7*   No results for input(s): LIPASE, AMYLASE in the last 168 hours. Recent Labs  Lab 04/03/21 1827  AMMONIA 37*   Coagulation Profile: Recent Labs  Lab 04/03/21 1830 04/04/21 0251 04/04/21 1516  INR 1.3* 1.4* 1.5*   Cardiac Enzymes: No results for input(s): CKTOTAL, CKMB, CKMBINDEX, TROPONINI in the last 168 hours. BNP (last 3 results) No results for input(s): PROBNP in the last 8760 hours. HbA1C: No results for input(s): HGBA1C in the last 72 hours. CBG: Recent Labs  Lab 04/04/21 0739 04/04/21 1227 04/04/21 1507 04/04/21 1912 04/04/21 2304  GLUCAP 142* 117* 105* 119* 112*   Lipid Profile: No results for input(s): CHOL, HDL, LDLCALC, TRIG, CHOLHDL, LDLDIRECT in the last 72 hours. Thyroid Function Tests: No results for input(s): TSH, T4TOTAL, FREET4, T3FREE, THYROIDAB in the last 72 hours. Anemia Panel: No results for input(s): VITAMINB12, FOLATE, FERRITIN, TIBC, IRON, RETICCTPCT in the last 72 hours. Sepsis Labs: No results for input(s): PROCALCITON, LATICACIDVEN in the last 168 hours.  Recent Results (from the past 240 hour(s))  MRSA Next Gen by PCR, Nasal     Status: None   Collection Time: 04/03/21  7:05 PM   Specimen: Nasal Mucosa; Nasal Swab  Result Value Ref Range Status   MRSA by PCR Next Gen NOT DETECTED NOT DETECTED Final    Comment: (NOTE) The GeneXpert MRSA Assay  (FDA approved for NASAL specimens only), is one component of a comprehensive MRSA colonization surveillance program. It is not intended to diagnose MRSA infection nor to guide or monitor treatment for MRSA infections. Test performance is not FDA approved in patients less than 21 years old. Performed at [facility], [address] [place],  [place] [address]          Radiology Studies: CT Head Wo Contrast  Result Date: 04/03/2021 CLINICAL DATA:  20 year old female with altered mental status. EXAM: CT HEAD WITHOUT CONTRAST TECHNIQUE: Contiguous axial images were obtained from the base of the skull through the vertex without intravenous contrast. COMPARISON:  None. FINDINGS: Brain: No evidence of acute infarction, hemorrhage, hydrocephalus, extra-axial collection or mass lesion/mass effect. Vascular: No hyperdense vessel or unexpected calcification. Skull: Normal. Negative for fracture or focal lesion. Sinuses/Orbits: Mild diffuse mucoperiosteal thickening of paranasal sinuses. There is opacification of the nasal passages and nasopharynx. The mastoid air cells are clear. Other: None IMPRESSION: 1. Normal noncontrast CT of the brain. 2. Opacification of the nasal passages and nasopharynx. Electronically Signed   By: Elgie Collard M.D.   On: 04/03/2021 17:14   DG Chest Port 1 View  Result Date: 04/03/2021 CLINICAL DATA:  Status post central line placement. EXAM: PORTABLE CHEST 1 VIEW COMPARISON:  Single-view of the chest earlier today. FINDINGS: The patient is rotated on the exam. New left IJ approach central venous catheter tip projects near the superior cavoatrial junction. ETT tip is in good position at the clavicular heads. OG tube courses into the stomach and below the inferior margin the film. Lungs are clear. No pneumothorax or pleural fluid. Heart size normal. IMPRESSION: New left IJ central venous catheter tip projects at the superior cavoatrial junction. Negative for pneumothorax.  Lungs remain clear. Electronically Signed   By: Drusilla Kanner M.D.   On: 04/03/2021 19:37   DG Chest Portable 1 View  Result Date: 04/03/2021 CLINICAL DATA:  Intubation. EXAM: PORTABLE CHEST 1 VIEW COMPARISON:  May 28, 2019. FINDINGS: The heart size and mediastinal contours are within normal limits. Both lungs are clear. Endotracheal and nasogastric tubes appear to be in grossly good position. The visualized skeletal structures are unremarkable. IMPRESSION: Endotracheal and nasogastric tubes are in grossly good position. No acute cardiopulmonary abnormality seen. Electronically Signed   By: Lupita Raider M.D.   On: 04/03/2021 15:37   EEG adult  Result Date: 04/03/2021 Charlsie Quest, MD     04/03/2021  9:01 PM Patient Name: Regene Mccarthy MRN: [id number] Epilepsy Attending: Charlsie Quest Referring Physician/Provider: Dr Lupita Leash, Date: 04/03/2021 Duration: 28.51 mins Patient history: 19yo s/p intentional overdose. EEG to evaluate for seizure Level of alertness:  comatose AEDs during EEG study: Propofol Technical aspects: This EEG study was done with scalp electrodes positioned according to the 10-20 International system of electrode placement. Electrical activity was acquired at a sampling rate of 500Hz  and reviewed with a high frequency filter of 70Hz  and a low frequency filter of 1Hz . EEG data were recorded continuously and digitally stored. Description: EEG showed continuous generalized 2-3Hz  delta slowing admixed with an excessive amount of 15 to 18 Hz beta activity distributed symmetrically and diffusely. Hyperventilation and photic stimulation were not performed.   ABNORMALITY - Continuous slow, generalized - Excessive beta, generalized IMPRESSION: This study is suggestive of severe diffuse encephalopathy, nonspecific etiology but likely related to sedation. The excessive beta activity seen in the background is most likely due to the effect of benzodiazepine and is a benign EEG  pattern. No seizures or epileptiform discharges were seen throughout the recording. Priyanka        Scheduled Meds:  Chlorhexidine Gluconate Cloth  6 each Topical Q0600   heparin injection (subcutaneous)  5,000 Units Subcutaneous Q8H   potassium chloride  40 mEq Oral Q4H  sodium chloride flush  10-40 mL Intracatheter Q12H   Continuous Infusions:  sodium chloride 10 mL/hr at 04/05/21 1000   acetylcysteine 15 mg/kg/hr (04/05/21 1000)   lactated ringers 125 mL/hr at 04/05/21 1000          Glade Lloyd, MD [facility] 04/05/2021, 11:30 AM

## 2021-04-05 NOTE — Consult Note (Signed)
[facility]   Reason for Consult:  Suicide attempt Referring Physician:  Lupita LeashMcQuaid, Douglas B Patient Identification: Elizabeth Brandt MRN:  [id number] Principal Diagnosis: <principal problem not specified> Diagnosis:  Active Problems:   Overdose   Encounter for central line placement   Total Time spent with patient: 30 minutes  Subjective:   Elizabeth Brandt is a 20 y.o. female patient admitted with acetominophen and ibuprofen overdose.  HPI:    Pt is a 20 yo female w/ hx of acne vulgaris treated w/ isotretinoin and no significant psychiatric history [facility] to overdose with ibuprofen and tylenol.  Pt seen this am. Pt doing well. Pt trying trying to "clear her head" and not think about her stressors. Pt did note that she had started Accutane in February which correlated with her feeling more depressed. Pt endorsed anhedonia, progressive loss of appetite, decreased concentration, and decreased energy since starting Accutane. Pt states her Accutane prescription ends this month and will no longer take as it has increased risk of depression and suicidality.  Pt denies present SI/HI/AVH  Past Psychiatric History: n/a  Risk to Self:   yes Risk to Others:   no Prior Inpatient Therapy:   no Prior Outpatient Therapy:   no  Past Medical History: No past medical history on file. No past surgical history on file. Family History: No family history on file. Family Psychiatric  History: n/a Social History:  Social History   Substance and Sexual Activity  Alcohol Use None     Social History   Substance and Sexual Activity  Drug Use Not on file    Social History   Socioeconomic History   Marital status: Single    Spouse name: Not on file   Number of children: Not on file   Years of education: Not on file   Highest education level: Not on file  Occupational History   Not on file  Tobacco Use   Smoking status: Not on file   Smokeless tobacco: Not  on file  Substance and Sexual Activity   Alcohol use: Not on file   Drug use: Not on file   Sexual activity: Not on file  Other Topics Concern   Not on file  Social History Narrative   Not on file   Social Determinants of Health   Financial Resource Strain: Not on file  Food Insecurity: Not on file  Transportation Needs: Not on file  Physical Activity: Not on file  Stress: Not on file  Social Connections: Not on file   Additional Social History:    Allergies:  No Known Allergies  Labs:  Results for orders placed or performed during the hospital encounter of 04/03/21 (from the past 48 hour(s))  CBG monitoring, ED     Status: Abnormal   Collection Time: 04/03/21  2:56 PM  Result Value Ref Range   Glucose-Capillary 128 (H) 70 - 99 mg/dL    Comment: Glucose reference range applies only to samples taken after fasting for at least 8 hours.  CBC with Differential     Status: Abnormal   Collection Time: 04/03/21  3:06 PM  Result Value Ref Range   WBC 7.5 4.0 - 10.5 K/uL   RBC 5.01 3.87 - 5.11 MIL/uL   Hemoglobin 12.4 12.0 - 15.0 g/dL   HCT 84.642.6 96.236.0 - 95.246.0 %   MCV 85.0 80.0 - 100.0 fL   MCH 24.8 (L) 26.0 - 34.0 pg   MCHC 29.1 (L) 30.0 - 36.0  g/dL   RDW 56.2 (H) 13.0 - 86.5 %   Platelets 277 150 - 400 K/uL    Comment: REPEATED TO VERIFY   nRBC 0.0 0.0 - 0.2 %   Neutrophils Relative % 70 %   Neutro Abs 5.2 1.7 - 7.7 K/uL   Lymphocytes Relative 20 %   Lymphs Abs 1.5 0.7 - 4.0 K/uL   Monocytes Relative 9 %   Monocytes Absolute 0.7 0.1 - 1.0 K/uL   Eosinophils Relative 0 %   Eosinophils Absolute 0.0 0.0 - 0.5 K/uL   Basophils Relative 1 %   Basophils Absolute 0.1 0.0 - 0.1 K/uL   Immature Granulocytes 0 %   Abs Immature Granulocytes 0.03 0.00 - 0.07 K/uL    Comment: Performed at [facility] Lab, [address] [place], [place] [address]  Comprehensive metabolic panel     Status: Abnormal   Collection Time: 04/03/21  3:06 PM  Result Value Ref Range   Sodium 140 135  - 145 mmol/L   Potassium 3.6 3.5 - 5.1 mmol/L   Chloride 107 98 - 111 mmol/L   CO2 13 (L) 22 - 32 mmol/L   Glucose, Bld 134 (H) 70 - 99 mg/dL    Comment: Glucose reference range applies only to samples taken after fasting for at least 8 hours.   BUN 12 6 - 20 mg/dL   Creatinine, Ser 6.29 (H) 0.44 - 1.00 mg/dL   Calcium 9.0 8.9 - 52.8 mg/dL   Total Protein 6.8 6.5 - 8.1 g/dL   Albumin 3.7 3.5 - 5.0 g/dL   AST 28 15 - 41 U/L   ALT 18 0 - 44 U/L   Alkaline Phosphatase 41 38 - 126 U/L   Total Bilirubin 0.2 (L) 0.3 - 1.2 mg/dL   GFR, Estimated >41 >32 mL/min    Comment: (NOTE) Calculated using the CKD-EPI Creatinine Equation (2021)    Anion gap 20 (H) 5 - 15    Comment: Performed at [facility], [address], [place], [place] [address]  Troponin I (High Sensitivity)     Status: None   Collection Time: 04/03/21  3:06 PM  Result Value Ref Range   Troponin I (High Sensitivity) 10 <18 ng/L    Comment: (NOTE) Elevated high sensitivity troponin I (hsTnI) values and significant  changes across serial measurements may suggest ACS but many other  chronic and acute conditions are known to elevate hsTnI results.  Refer to the "Links" section for chest pain algorithms and additional  guidance. Performed at [facility], [address] [place], [place] [address]   Salicylate level     Status: Abnormal   Collection Time: 04/03/21  3:10 PM  Result Value Ref Range   Salicylate Lvl <7.0 (L) 7.0 - 30.0 mg/dL    Comment: Performed at [facility], [address] [place], [place] [address]  Acetaminophen level     Status: Abnormal   Collection Time: 04/03/21  3:10 PM  Result Value Ref Range   Acetaminophen (Tylenol), Serum 110 (H) 10 - 30 ug/mL    Comment: (NOTE) Therapeutic concentrations vary significantly. A range of 10-30 ug/mL  may be an effective concentration for many patients. However, some  are best treated at concentrations outside of this range. Acetaminophen  concentrations >150 ug/mL at 4 hours after ingestion  and >50 ug/mL at 12 hours after ingestion are often associated with  toxic reactions.  Performed at [facility], [address], [place], [place] [address]   Ethanol     Status: None   Collection Time: 04/03/21  3:10 PM  Result Value Ref Range   Alcohol, Ethyl (B) <10 <10 mg/dL    Comment: (NOTE) Lowest detectable limit for serum alcohol is 10 mg/dL.  For medical purposes only. Performed at [facility], [address] [place], [place] [address]   HIV Antibody (routine testing w rflx)     Status: None   Collection Time: 04/03/21  3:10 PM  Result Value Ref Range   HIV Screen 4th Generation wRfx Non Reactive Non Reactive    Comment: Performed at [facility], [address] [place], [place] [address]  Magnesium     Status: None   Collection Time: 04/03/21  3:10 PM  Result Value Ref Range   Magnesium 2.2 1.7 - 2.4 mg/dL    Comment: Performed at [facility], [address], [place], [place] [address]  Phosphorus     Status: None   Collection Time: 04/03/21  3:10 PM  Result Value Ref Range   Phosphorus 3.1 2.5 - 4.6 mg/dL    Comment: Performed at [facility], [address] [place], [place] [address]  Rapid urine drug screen (hospital performed)     Status: None   Collection Time: 04/03/21  3:28 PM  Result Value Ref Range   Opiates NONE DETECTED NONE DETECTED   Cocaine NONE DETECTED NONE DETECTED   Benzodiazepines NONE DETECTED NONE DETECTED   Amphetamines NONE DETECTED NONE DETECTED   Tetrahydrocannabinol NONE DETECTED NONE DETECTED   Barbiturates NONE DETECTED NONE DETECTED    Comment: (NOTE) DRUG SCREEN FOR MEDICAL PURPOSES ONLY.  IF CONFIRMATION IS NEEDED FOR ANY PURPOSE, NOTIFY LAB WITHIN 5 DAYS.  LOWEST DETECTABLE LIMITS FOR URINE DRUG SCREEN Drug Class                     Cutoff (ng/mL) Amphetamine and metabolites    1000 Barbiturate and metabolites    200 Benzodiazepine                  200 Tricyclics and metabolites     300 Opiates and metabolites        300 Cocaine and metabolites        300 THC                            50 Performed at [facility] Lab, [address], [place], [place] [address]   I-Stat arterial blood gas, ED     Status: Abnormal   Collection Time: 04/03/21  3:44 PM  Result Value Ref Range   pH, Arterial 7.246 (L) 7.350 - 7.450   pCO2 arterial 37.5 32.0 - 48.0 mmHg   pO2, Arterial 540 (H) 83.0 - 108.0 mmHg   Bicarbonate 16.3 (L) 20.0 - 28.0 mmol/L   TCO2 17 (L) 22 - 32 mmol/L   O2 Saturation 100.0 %   Acid-base deficit 10.0 (H) 0.0 - 2.0 mmol/L   Sodium 139 135 - 145 mmol/L   Potassium 3.8 3.5 - 5.1 mmol/L   Calcium, Ion 1.12 (L) 1.15 - 1.40 mmol/L   HCT 39.0 36.0 - 46.0 %   Hemoglobin 13.3 12.0 - 15.0 g/dL   Sample type ARTERIAL   Troponin I (High Sensitivity)     Status: None   Collection Time: 04/03/21  5:06 PM  Result Value Ref Range   Troponin I (High Sensitivity) 17 <18 ng/L    Comment: (NOTE) Elevated high sensitivity troponin I (hsTnI) values and significant  changes across serial measurements may suggest ACS but many other  chronic and acute conditions are known to elevate hsTnI results.  Refer to the "Links" section for chest pain algorithms and additional  guidance. Performed at [facility], [address], [place], [place] [address]   Osmolality     Status: Abnormal   Collection Time: 04/03/21  5:06 PM  Result Value Ref Range   Osmolality 322 (HH) 275 - 295 mOsm/kg    Comment: CRITICAL RESULT CALLED TO, READ BACK BY AND VERIFIED WITH: Eliot Ford RN [phone] K FORSYTH Performed at [facility], [address], [place], [place] [address]   Volatiles,Blood (acetone,ethanol,isoprop,methanol)     Status: None   Collection Time: 04/03/21  5:06 PM  Result Value Ref Range   Acetone, blood <0.010 0.000 - 0.010 g/dL    Comment: (NOTE)                                Detection Limit = 0.010 A  courtesy copy of this report has been sent to [facility], [phone] Performed At: [facility] [address] [place], [place] [id number] Jolene Schimke MD KG:[id number]    Ethanol, blood <0.010 0.000 - 0.010 g/dL    Comment: (NOTE) Reported patient Mulki Roesler at [facility] as                                Detection Limit = 0.010 This test was developed and its performance characteristics determined by [facility]. It has not been cleared or approved by the Food and Drug Administration.    Isopropanol, blood <0.010 0.000 - 0.010 g/dL    Comment:                                 Detection Limit = 0.010   Methanol, blood <0.010 0.000 - 0.010 g/dL    Comment:                                 Detection Limit = 0.010  Sodium, urine, random     Status: None   Collection Time: 04/03/21  5:08 PM  Result Value Ref Range   Sodium, Ur 84 mmol/L    Comment: Performed at [facility], [address] [place], [place] [address]  Ammonia     Status: Abnormal   Collection Time: 04/03/21  6:27 PM  Result Value Ref Range   Ammonia 37 (H) 9 - 35 umol/L    Comment: Performed at [facility], [address], [place], [place] [address]  Basic metabolic panel     Status: Abnormal   Collection Time: 04/03/21  6:30 PM  Result Value Ref Range   Sodium 140 135 - 145 mmol/L   Potassium 4.5 3.5 - 5.1 mmol/L   Chloride 111 98 - 111 mmol/L   CO2 14 (L) 22 - 32 mmol/L   Glucose, Bld 171 (H) 70 - 99 mg/dL    Comment: Glucose reference range applies only to samples taken after fasting for  at least 8 hours.   BUN 9 6 - 20 mg/dL   Creatinine, Ser 1.61 (H) 0.44 - 1.00 mg/dL   Calcium 8.9 8.9 - 09.6 mg/dL   GFR, Estimated >04 >54 mL/min    Comment: (NOTE) Calculated using the CKD-EPI Creatinine Equation (2021)    Anion gap 15 5 - 15    Comment: Performed at [facility], [address] [place], [place] [address]  Protime-INR     Status: Abnormal   Collection  Time: 04/03/21  6:30 PM  Result Value Ref Range   Prothrombin Time 16.5 (H) 11.4 - 15.2 seconds   INR 1.3 (H) 0.8 - 1.2    Comment: (NOTE) INR goal varies based on device and disease states. Performed at [facility], [address] [place], [place] [address]   Hepatic function panel     Status: None   Collection Time: 04/03/21  6:30 PM  Result Value Ref Range   Total Protein 7.6 6.5 - 8.1 g/dL   Albumin 4.0 3.5 - 5.0 g/dL   AST 25 15 - 41 U/L   ALT 22 0 - 44 U/L   Alkaline Phosphatase 42 38 - 126 U/L   Total Bilirubin 0.6 0.3 - 1.2 mg/dL   Bilirubin, Direct <2.9 0.0 - 0.2 mg/dL   Indirect Bilirubin NOT CALCULATED 0.3 - 0.9 mg/dL    Comment: Performed at [facility], [address], [place], [place] [address]  MRSA Next Gen by PCR, Nasal     Status: None   Collection Time: 04/03/21  7:05 PM   Specimen: Nasal Mucosa; Nasal Swab  Result Value Ref Range   MRSA by PCR Next Gen NOT DETECTED NOT DETECTED    Comment: (NOTE) The GeneXpert MRSA Assay (FDA approved for NASAL specimens only), is one component of a comprehensive MRSA colonization surveillance program. It is not intended to diagnose MRSA infection nor to guide or monitor treatment for MRSA infections. Test performance is not FDA approved in patients less than 56 years old. Performed at [facility], [address] [place], [place] [address]   Glucose, capillary     Status: Abnormal   Collection Time: 04/03/21  7:08 PM  Result Value Ref Range   Glucose-Capillary 170 (H) 70 - 99 mg/dL    Comment: Glucose reference range applies only to samples taken after fasting for at least 8 hours.  Osmolality, urine     Status: None   Collection Time: 04/03/21  7:35 PM  Result Value Ref Range   Osmolality, Ur 372 300 - 900 mOsm/kg    Comment: Performed at [facility], [address], [place], [place] [address]  I-STAT 7, (LYTES, BLD GAS, ICA, H+H)     Status: Abnormal   Collection Time: 04/03/21  8:46 PM   Result Value Ref Range   pH, Arterial 7.274 (L) 7.350 - 7.450   pCO2 arterial 28.2 (L) 32.0 - 48.0 mmHg   pO2, Arterial 149 (H) 83.0 - 108.0 mmHg   Bicarbonate 13.1 (L) 20.0 - 28.0 mmol/L   TCO2 14 (L) 22 - 32 mmol/L   O2 Saturation 99.0 %   Acid-base deficit 12.0 (H) 0.0 - 2.0 mmol/L   Sodium 144 135 - 145 mmol/L   Potassium 4.1 3.5 - 5.1 mmol/L   Calcium, Ion 1.25 1.15 - 1.40 mmol/L   HCT 43.0 36.0 - 46.0 %   Hemoglobin 14.6 12.0 - 15.0 g/dL   Patient temperature  98.4 F    Collection site Radial    Drawn by RT    Sample type ARTERIAL   Acetaminophen level     Status: Abnormal   Collection Time: 04/03/21  9:14 PM  Result Value Ref Range   Acetaminophen (Tylenol), Serum 67 (H) 10 - 30 ug/mL    Comment: (NOTE) Therapeutic concentrations vary significantly. A range of 10-30 ug/mL  may be an effective concentration for many patients. However, some  are best treated at concentrations outside of this range. Acetaminophen concentrations >150 ug/mL at 4 hours after ingestion  and >50 ug/mL at 12 hours after ingestion are often associated with  toxic reactions.  Performed at [facility], [address], [place], [place] [address]   Glucose, capillary     Status: Abnormal   Collection Time: 04/03/21 11:05 PM  Result Value Ref Range   Glucose-Capillary 133 (H) 70 - 99 mg/dL    Comment: Glucose reference range applies only to samples taken after fasting for at least 8 hours.  Urinalysis, Routine w reflex microscopic     Status: Abnormal   Collection Time: 04/04/21  2:51 AM  Result Value Ref Range   Color, Urine YELLOW YELLOW   APPearance CLEAR CLEAR   Specific Gravity, Urine 1.018 1.005 - 1.030   pH 5.0 5.0 - 8.0   Glucose, UA NEGATIVE NEGATIVE mg/dL   Hgb urine dipstick NEGATIVE NEGATIVE   Bilirubin Urine NEGATIVE NEGATIVE   Ketones, ur 80 (A) NEGATIVE mg/dL   Protein, ur 30 (A) NEGATIVE mg/dL   Nitrite NEGATIVE NEGATIVE   Leukocytes,Ua NEGATIVE NEGATIVE   RBC / HPF  0-5 0 - 5 RBC/hpf   WBC, UA 11-20 0 - 5 WBC/hpf   Bacteria, UA NONE SEEN NONE SEEN   Mucus PRESENT     Comment: Performed at [facility], [address], [place], [place] [address]  Protime-INR     Status: Abnormal   Collection Time: 04/04/21  2:51 AM  Result Value Ref Range   Prothrombin Time 16.7 (H) 11.4 - 15.2 seconds   INR 1.4 (H) 0.8 - 1.2    Comment: (NOTE) INR goal varies based on device and disease states. Performed at [facility], [address], [place], [place] [address]   Acetaminophen level     Status: Abnormal   Collection Time: 04/04/21  2:51 AM  Result Value Ref Range   Acetaminophen (Tylenol), Serum 39 (H) 10 - 30 ug/mL    Comment: (NOTE) Therapeutic concentrations vary significantly. A range of 10-30 ug/mL  may be an effective concentration for many patients. However, some  are best treated at concentrations outside of this range. Acetaminophen concentrations >150 ug/mL at 4 hours after ingestion  and >50 ug/mL at 12 hours after ingestion are often associated with  toxic reactions.  Performed at [facility], [address] [place], [place] [address]   CBC     Status: Abnormal   Collection Time: 04/04/21  2:51 AM  Result Value Ref Range   WBC 6.0 4.0 - 10.5 K/uL   RBC 4.99 3.87 - 5.11 MIL/uL   Hemoglobin 12.4 12.0 - 15.0 g/dL   HCT 61.6 07.3 - 71.0 %   MCV 80.6 80.0 - 100.0 fL   MCH 24.8 (L) 26.0 - 34.0 pg   MCHC 30.8 30.0 - 36.0 g/dL   RDW 62.6 (H) 94.8 - 54.6 %   Platelets 300 150 - 400 K/uL   nRBC 0.0  0.0 - 0.2 %    Comment: Performed at [facility], [address] [place], [place] [address]  Salicylate level     Status: Abnormal   Collection Time: 04/04/21  2:51 AM  Result Value Ref Range   Salicylate Lvl <7.0 (L) 7.0 - 30.0 mg/dL    Comment: Performed at [facility], [address] [place], [place] [address]  Comprehensive metabolic panel     Status: Abnormal   Collection Time: 04/04/21  2:51 AM  Result  Value Ref Range   Sodium 142 135 - 145 mmol/L   Potassium 3.7 3.5 - 5.1 mmol/L   Chloride 111 98 - 111 mmol/L   CO2 18 (L) 22 - 32 mmol/L   Glucose, Bld 144 (H) 70 - 99 mg/dL    Comment: Glucose reference range applies only to samples taken after fasting for at least 8 hours.   BUN 8 6 - 20 mg/dL   Creatinine, Ser 0.98 (H) 0.44 - 1.00 mg/dL   Calcium 8.8 (L) 8.9 - 10.3 mg/dL   Total Protein 6.3 (L) 6.5 - 8.1 g/dL   Albumin 3.3 (L) 3.5 - 5.0 g/dL   AST 22 15 - 41 U/L   ALT 21 0 - 44 U/L   Alkaline Phosphatase 38 38 - 126 U/L   Total Bilirubin 0.7 0.3 - 1.2 mg/dL   GFR, Estimated 57 (L) >60 mL/min    Comment: (NOTE) Calculated using the CKD-EPI Creatinine Equation (2021)    Anion gap 13 5 - 15    Comment: Performed at [facility], [address], [place], [place] [address]  Glucose, capillary     Status: Abnormal   Collection Time: 04/04/21  3:06 AM  Result Value Ref Range   Glucose-Capillary 131 (H) 70 - 99 mg/dL    Comment: Glucose reference range applies only to samples taken after fasting for at least 8 hours.  Comprehensive metabolic panel     Status: Abnormal   Collection Time: 04/04/21  7:00 AM  Result Value Ref Range   Sodium 142 135 - 145 mmol/L   Potassium 2.6 (LL) 3.5 - 5.1 mmol/L    Comment: CRITICAL RESULT CALLED TO, READ BACK BY AND VERIFIED WITH: L.WILSON,RN 0830 04/04/21 CLARK,S CORRECTED ON 07/27 AT 0954: PREVIOUSLY REPORTED AS 2.6 CRITICAL RESULT CALLED TO, READ BACK BY AND VERIFIED WITH: L.WILSON,RN 0830 04/04/24 CLARK,S    Chloride 108 98 - 111 mmol/L   CO2 20 (L) 22 - 32 mmol/L   Glucose, Bld 150 (H) 70 - 99 mg/dL    Comment: Glucose reference range applies only to samples taken after fasting for at least 8 hours.   BUN 7 6 - 20 mg/dL   Creatinine, Ser 7.82 (H) 0.44 - 1.00 mg/dL   Calcium 8.0 (L) 8.9 - 10.3 mg/dL   Total Protein 5.8 (L) 6.5 - 8.1 g/dL   Albumin 3.0 (L) 3.5 - 5.0 g/dL   AST 22 15 - 41 U/L   ALT 19 0 - 44 U/L   Alkaline Phosphatase  36 (L) 38 - 126 U/L   Total Bilirubin 0.6 0.3 - 1.2 mg/dL   GFR, Estimated 52 (L) >60 mL/min    Comment: (NOTE) Calculated using the CKD-EPI Creatinine Equation (2021)    Anion gap 14 5 - 15    Comment: Performed at [facility], [address], [place], [place] [address]  Glucose, capillary     Status: Abnormal   Collection  Time: 04/04/21  7:39 AM  Result Value Ref Range   Glucose-Capillary 142 (H) 70 - 99 mg/dL    Comment: Glucose reference range applies only to samples taken after fasting for at least 8 hours.  Glucose, capillary     Status: Abnormal   Collection Time: 04/04/21 12:27 PM  Result Value Ref Range   Glucose-Capillary 117 (H) 70 - 99 mg/dL    Comment: Glucose reference range applies only to samples taken after fasting for at least 8 hours.  Glucose, capillary     Status: Abnormal   Collection Time: 04/04/21  3:07 PM  Result Value Ref Range   Glucose-Capillary 105 (H) 70 - 99 mg/dL    Comment: Glucose reference range applies only to samples taken after fasting for at least 8 hours.  Acetaminophen level     Status: None   Collection Time: 04/04/21  3:16 PM  Result Value Ref Range   Acetaminophen (Tylenol), Serum 11 10 - 30 ug/mL    Comment: (NOTE) Therapeutic concentrations vary significantly. A range of 10-30 ug/mL  may be an effective concentration for many patients. However, some  are best treated at concentrations outside of this range. Acetaminophen concentrations >150 ug/mL at 4 hours after ingestion  and >50 ug/mL at 12 hours after ingestion are often associated with  toxic reactions.  Performed at [facility], [address], [place], [place] [address]   Comprehensive metabolic panel     Status: Abnormal   Collection Time: 04/04/21  3:16 PM  Result Value Ref Range   Sodium 141 135 - 145 mmol/L   Potassium 3.3 (L) 3.5 - 5.1 mmol/L    Comment: NO VISIBLE HEMOLYSIS   Chloride 107 98 - 111 mmol/L   CO2 24 22 - 32 mmol/L   Glucose, Bld 113  (H) 70 - 99 mg/dL    Comment: Glucose reference range applies only to samples taken after fasting for at least 8 hours.   BUN 9 6 - 20 mg/dL   Creatinine, Ser 6.44 (H) 0.44 - 1.00 mg/dL   Calcium 8.0 (L) 8.9 - 10.3 mg/dL   Total Protein 5.9 (L) 6.5 - 8.1 g/dL   Albumin 3.1 (L) 3.5 - 5.0 g/dL   AST 26 15 - 41 U/L   ALT 22 0 - 44 U/L   Alkaline Phosphatase 35 (L) 38 - 126 U/L   Total Bilirubin 1.8 (H) 0.3 - 1.2 mg/dL   GFR, Estimated 40 (L) >60 mL/min    Comment: (NOTE) Calculated using the CKD-EPI Creatinine Equation (2021)    Anion gap 10 5 - 15    Comment: Performed at [facility] Lab, [address], [place], [place] [address]  Protime-INR     Status: Abnormal   Collection Time: 04/04/21  3:16 PM  Result Value Ref Range   Prothrombin Time 18.4 (H) 11.4 - 15.2 seconds   INR 1.5 (H) 0.8 - 1.2    Comment: (NOTE) INR goal varies based on device and disease states. Performed at [facility], [address], [place], [place] [address]   Glucose, capillary     Status: Abnormal   Collection Time: 04/04/21  7:12 PM  Result Value Ref Range   Glucose-Capillary 119 (H) 70 - 99 mg/dL    Comment: Glucose reference range applies only to samples taken after fasting for at least 8 hours.  Glucose, capillary     Status: Abnormal   Collection Time: 04/04/21 11:04 PM  Result  Value Ref Range   Glucose-Capillary 112 (H) 70 - 99 mg/dL    Comment: Glucose reference range applies only to samples taken after fasting for at least 8 hours.  Comprehensive metabolic panel     Status: Abnormal   Collection Time: 04/05/21  4:06 AM  Result Value Ref Range   Sodium 138 135 - 145 mmol/L   Potassium 3.1 (L) 3.5 - 5.1 mmol/L   Chloride 105 98 - 111 mmol/L   CO2 25 22 - 32 mmol/L   Glucose, Bld 106 (H) 70 - 99 mg/dL    Comment: Glucose reference range applies only to samples taken after fasting for at least 8 hours.   BUN 8 6 - 20 mg/dL   Creatinine, Ser 8.11 (H) 0.44 - 1.00 mg/dL   Calcium 8.2  (L) 8.9 - 10.3 mg/dL   Total Protein 5.2 (L) 6.5 - 8.1 g/dL   Albumin 2.7 (L) 3.5 - 5.0 g/dL   AST 22 15 - 41 U/L   ALT 17 0 - 44 U/L   Alkaline Phosphatase 31 (L) 38 - 126 U/L   Total Bilirubin 2.8 (H) 0.3 - 1.2 mg/dL   GFR, Estimated 50 (L) >60 mL/min    Comment: (NOTE) Calculated using the CKD-EPI Creatinine Equation (2021)    Anion gap 8 5 - 15    Comment: Performed at [facility], [address] [place], [place] [address]    Current Facility-Administered Medications  Medication Dose Route Frequency Provider Last Rate Last Admin   0.9 %  sodium chloride infusion   Intravenous PRN Max Fickle B, MD 10 mL/hr at 04/05/21 0900 Infusion Verify at 04/05/21 0900   acetylcysteine (ACETADOTE) 24,000 mg in dextrose 5 % 600 mL (40 mg/mL) infusion  15 mg/kg/hr Intravenous Continuous Cathie Hoops, RPH 34.7 mL/hr at 04/05/21 0900 15 mg/kg/hr at 04/05/21 0900   Chlorhexidine Gluconate Cloth 2 % PADS 6 each  6 each Topical Q0600 Max Fickle B, MD   6 each at 04/04/21 1000   docusate (COLACE) 50 MG/5ML liquid 100 mg  100 mg Per Tube BID PRN Trudee Grip, RPH       heparin injection 5,000 Units  5,000 Units Subcutaneous Q8H Selmer Dominion B, NP   5,000 Units at 04/05/21 2956   lactated ringers infusion   Intravenous Continuous Max Fickle B, MD 125 mL/hr at 04/05/21 0900 Infusion Verify at 04/05/21 0900   polyethylene glycol (MIRALAX / GLYCOLAX) packet 17 g  17 g Per Tube Daily PRN Filbert Schilder M, RPH       potassium chloride SA (KLOR-CON) CR tablet 40 mEq  40 mEq Oral Q4H Alekh, Kshitiz, MD   40 mEq at 04/05/21 0753   sodium chloride flush (NS) 0.9 % injection 10-40 mL  10-40 mL Intracatheter Q12H Max Fickle B, MD   10 mL at 04/04/21 2125   sodium chloride flush (NS) 0.9 % injection 10-40 mL  10-40 mL Intracatheter PRN Lupita Leash, MD          Psychiatric Specialty Exam:  Presentation  General Appearance:  Appropriate for Environment; Well  Groomed Eye Contact: Minimal Speech: Slow Speech Volume: Decreased  Mood and Affect  Mood: Depressed Affect: Depressed  Thought Process  Thought Processes: Goal Directed; Linear Descriptions of Associations:Intact Orientation:Full (Time, Place and Person) Thought Content:Logical History of Schizophrenia/Schizoaffective disorder:No data recorded Duration of Psychotic Symptoms:No data recorded Hallucinations:Hallucinations: None Ideas of Reference:None Suicidal Thoughts:Suicidal Thoughts: No Homicidal Thoughts:Homicidal Thoughts: No  Sensorium  Memory: Immediate Fair; Recent Fair; Remote Fair Judgment: Poor Insight: Poor  Executive Functions  Concentration: Fair Attention Span: Fair Recall: [employer] of Knowledge: Fair Language: Fair  Psychomotor Activity  Psychomotor Activity: Psychomotor Activity: Normal  Assets  Assets: Social Support; Desire for Improvement; Communication Skills; Housing  Sleep  Sleep: Sleep: Fair  Physical Exam: Physical Exam Vitals reviewed.  Constitutional:      Appearance: Normal appearance.  HENT:     Head: Normocephalic and atraumatic.  Eyes:     Extraocular Movements: Extraocular movements intact.  Cardiovascular:     Rate and Rhythm: Normal rate and regular rhythm.  Pulmonary:     Effort: Pulmonary effort is normal.     Breath sounds: Normal breath sounds.  Abdominal:     General: Abdomen is flat.     Palpations: Abdomen is soft.  Musculoskeletal:        General: Normal range of motion.     Cervical back: Normal range of motion.  Skin:    General: Skin is warm and dry.  Neurological:     General: No focal deficit present.     Mental Status: She is alert and oriented to person, place, and time.   Review of Systems  Constitutional:  Negative for chills and fever.  Respiratory:  Negative for cough, shortness of breath and wheezing.   Cardiovascular:  Negative for chest pain and palpitations.   Gastrointestinal:  Negative for abdominal pain, nausea and vomiting.  Skin:  Negative for itching and rash.  Neurological:  Negative for dizziness and headaches.  Blood pressure (!) 131/53, pulse 86, temperature 99 F (37.2 C), temperature source Oral, resp. rate 18, height 5\' 7"  (1.702 m), weight 96.5 kg, SpO2 100 %. Body mass index is 33.32 kg/m.  Treatment Plan Summary: Pt is a 20 yo female w/ hx of acne vulgaris treated with isotretinoin admitted due to tylenol and ibuprofen overdose. Pt denies present SI/HI/AVH. Still recommending inpatient psychiatric hospitalization. Pt plans to d/c Accutane.   MDD vs substance induced mood disorder -recommend inpatient psychiatric hospitalization once medically stable -recommend d/c Accutane as it worsens depression and increases risk for suicidality -no psychiatric medications at this time as patient is still stabilizing from overdose  Disposition: Recommend psychiatric Inpatient admission when medically cleared. Supportive therapy provided about ongoing stressors.  Thank you for the consult 26, MD PGY1 Psychiatry Resident 04/05/2021 9:16 AM

## 2021-04-05 NOTE — Progress Notes (Signed)
APAP level came back undetectable and LFTs wnl this PM. Poison control ok to stop [employer]. Dr. Hanley Ben is aware.   Ulyses Southward, PharmD, BCIDP, AAHIVP, CPP Infectious Disease Pharmacist 04/05/2021 4:26 PM

## 2021-04-05 NOTE — Progress Notes (Signed)
eLink assisted with video call

## 2021-04-06 ENCOUNTER — Other Ambulatory Visit: Payer: Self-pay | Admitting: Psychiatry

## 2021-04-06 DIAGNOSIS — N179 Acute kidney failure, unspecified: Secondary | ICD-10-CM | POA: Diagnosis not present

## 2021-04-06 DIAGNOSIS — T50902A Poisoning by unspecified drugs, medicaments and biological substances, intentional self-harm, initial encounter: Secondary | ICD-10-CM | POA: Diagnosis not present

## 2021-04-06 DIAGNOSIS — T391X2A Poisoning by 4-Aminophenol derivatives, intentional self-harm, initial encounter: Secondary | ICD-10-CM | POA: Diagnosis not present

## 2021-04-06 DIAGNOSIS — J9601 Acute respiratory failure with hypoxia: Secondary | ICD-10-CM | POA: Diagnosis not present

## 2021-04-06 LAB — COMPREHENSIVE METABOLIC PANEL
ALT: 16 U/L (ref 0–44)
AST: 27 U/L (ref 15–41)
Albumin: 2.6 g/dL — ABNORMAL LOW (ref 3.5–5.0)
Alkaline Phosphatase: 31 U/L — ABNORMAL LOW (ref 38–126)
Anion gap: 4 — ABNORMAL LOW (ref 5–15)
BUN: 10 mg/dL (ref 6–20)
CO2: 24 mmol/L (ref 22–32)
Calcium: 8.5 mg/dL — ABNORMAL LOW (ref 8.9–10.3)
Chloride: 109 mmol/L (ref 98–111)
Creatinine, Ser: 0.96 mg/dL (ref 0.44–1.00)
GFR, Estimated: >60 mL/min (ref >60)
Glucose, Bld: 100 mg/dL — ABNORMAL HIGH (ref 70–99)
Potassium: 3.6 mmol/L (ref 3.5–5.1)
Sodium: 137 mmol/L (ref 135–145)
Total Bilirubin: 1.3 mg/dL — ABNORMAL HIGH (ref 0.3–1.2)
Total Protein: 5 g/dL — ABNORMAL LOW (ref 6.5–8.1)

## 2021-04-06 LAB — RESP PANEL BY RT-PCR (FLU A&B, COVID) ARPGX2
Influenza A by PCR: NEGATIVE
Influenza B by PCR: NEGATIVE
SARS Coronavirus 2 by RT PCR: NEGATIVE

## 2021-04-06 LAB — MAGNESIUM: Magnesium: 1.6 mg/dL — ABNORMAL LOW (ref 1.7–2.4)

## 2021-04-06 MED ORDER — MAGNESIUM SULFATE 2 GM/50ML IV SOLN
2.0000 g | Freq: Once | INTRAVENOUS | Status: AC
  Administered 2021-04-06: 2 g via INTRAVENOUS
  Filled 2021-04-06: qty 50

## 2021-04-06 NOTE — Progress Notes (Signed)
Patient information has been sent to [facility] via secure chat to review for potential admission. Patient meets inpatient criteria per Park Pope, MD.   Situation ongoing, CSW will continue to monitor progress.    Signed:  Damita Dunnings, MSW, LCSW-A  04/06/2021 9:47 AM

## 2021-04-06 NOTE — Plan of Care (Signed)

## 2021-04-06 NOTE — Consult Note (Signed)
[facility] Face-to-Face Psychiatry Consult   Reason for Consult:  Suicide attempt Referring Physician:  Lupita Leash Patient Identification: Elizabeth Brandt MRN:  [id number] Principal Diagnosis: <principal problem not specified> Diagnosis:  Active Problems:   Overdose   Encounter for central line placement   Total Time spent with patient: 30 minutes  Subjective:   Elizabeth Brandt is a 20 y.o. female patient admitted with acetominophen and ibuprofen overdose.  HPI:    Pt is a 20 yo female w/ hx of acne vulgaris treated w/ isotretinoin and no significant psychiatric history admitted to [facility] due to overdose with ibuprofen and tylenol.  Pt seen this am. Pt doing well, been able to sleep and has an appetite.   Pt denies present SI/HI/AVH  Past Psychiatric History: n/a  Risk to Self:   yes Risk to Others:   no Prior Inpatient Therapy:   no Prior Outpatient Therapy:   no  Past Medical History: No past medical history on file. No past surgical history on file. Family History: No family history on file. Family Psychiatric  History: n/a Social History:  Social History   Substance and Sexual Activity  Alcohol Use None     Social History   Substance and Sexual Activity  Drug Use Not on file    Social History   Socioeconomic History   Marital status: Single    Spouse name: Not on file   Number of children: Not on file   Years of education: Not on file   Highest education level: Not on file  Occupational History   Not on file  Tobacco Use   Smoking status: Not on file   Smokeless tobacco: Not on file  Substance and Sexual Activity   Alcohol use: Not on file   Drug use: Not on file   Sexual activity: Not on file  Other Topics Concern   Not on file  Social History Narrative   Not on file   Social Determinants of Health   Financial Resource Strain: Not on file  Food Insecurity: Not on file  Transportation Needs: Not on file  Physical Activity: Not on file   Stress: Not on file  Social Connections: Not on file   Additional Social History:    Allergies:  No Known Allergies  Labs:  Results for orders placed or performed during the hospital encounter of 04/03/21 (from the past 48 hour(s))  Glucose, capillary     Status: Abnormal   Collection Time: 04/04/21  3:07 PM  Result Value Ref Range   Glucose-Capillary 105 (H) 70 - 99 mg/dL    Comment: Glucose reference range applies only to samples taken after fasting for at least 8 hours.  Acetaminophen level     Status: None   Collection Time: 04/04/21  3:16 PM  Result Value Ref Range   Acetaminophen (Tylenol), Serum 11 10 - 30 ug/mL    Comment: (NOTE) Therapeutic concentrations vary significantly. A range of 10-30 ug/mL  may be an effective concentration for many patients. However, some  are best treated at concentrations outside of this range. Acetaminophen concentrations >150 ug/mL at 4 hours after ingestion  and >50 ug/mL at 12 hours after ingestion are often associated with  toxic reactions.  Performed at [facility], [address] [place], [place] [address]   Comprehensive metabolic panel     Status: Abnormal   Collection Time: 04/04/21  3:16 PM  Result Value Ref Range   Sodium 141 135 - 145 mmol/L  Potassium 3.3 (L) 3.5 - 5.1 mmol/L    Comment: NO VISIBLE HEMOLYSIS   Chloride 107 98 - 111 mmol/L   CO2 24 22 - 32 mmol/L   Glucose, Bld 113 (H) 70 - 99 mg/dL    Comment: Glucose reference range applies only to samples taken after fasting for at least 8 hours.   BUN 9 6 - 20 mg/dL   Creatinine, Ser 4.091.85 (H) 0.44 - 1.00 mg/dL   Calcium 8.0 (L) 8.9 - 10.3 mg/dL   Total Protein 5.9 (L) 6.5 - 8.1 g/dL   Albumin 3.1 (L) 3.5 - 5.0 g/dL   AST 26 15 - 41 U/L   ALT 22 0 - 44 U/L   Alkaline Phosphatase 35 (L) 38 - 126 U/L   Total Bilirubin 1.8 (H) 0.3 - 1.2 mg/dL   GFR, Estimated 40 (L) >60 mL/min    Comment: (NOTE) Calculated using the CKD-EPI Creatinine Equation (2021)     Anion gap 10 5 - 15    Comment: Performed at [facility], [address] [place], [place] [address]  Protime-INR     Status: Abnormal   Collection Time: 04/04/21  3:16 PM  Result Value Ref Range   Prothrombin Time 18.4 (H) 11.4 - 15.2 seconds   INR 1.5 (H) 0.8 - 1.2    Comment: (NOTE) INR goal varies based on device and disease states. Performed at [facility], [address] [place], [place] [address]   Glucose, capillary     Status: Abnormal   Collection Time: 04/04/21  7:12 PM  Result Value Ref Range   Glucose-Capillary 119 (H) 70 - 99 mg/dL    Comment: Glucose reference range applies only to samples taken after fasting for at least 8 hours.  Glucose, capillary     Status: Abnormal   Collection Time: 04/04/21 11:04 PM  Result Value Ref Range   Glucose-Capillary 112 (H) 70 - 99 mg/dL    Comment: Glucose reference range applies only to samples taken after fasting for at least 8 hours.  Comprehensive metabolic panel     Status: Abnormal   Collection Time: 04/05/21  4:06 AM  Result Value Ref Range   Sodium 138 135 - 145 mmol/L   Potassium 3.1 (L) 3.5 - 5.1 mmol/L   Chloride 105 98 - 111 mmol/L   CO2 25 22 - 32 mmol/L   Glucose, Bld 106 (H) 70 - 99 mg/dL    Comment: Glucose reference range applies only to samples taken after fasting for at least 8 hours.   BUN 8 6 - 20 mg/dL   Creatinine, Ser 5.621.53 (H) 0.44 - 1.00 mg/dL   Calcium 8.2 (L) 8.9 - 10.3 mg/dL   Total Protein 5.2 (L) 6.5 - 8.1 g/dL   Albumin 2.7 (L) 3.5 - 5.0 g/dL   AST 22 15 - 41 U/L   ALT 17 0 - 44 U/L   Alkaline Phosphatase 31 (L) 38 - 126 U/L   Total Bilirubin 2.8 (H) 0.3 - 1.2 mg/dL   GFR, Estimated 50 (L) >60 mL/min    Comment: (NOTE) Calculated using the CKD-EPI Creatinine Equation (2021)    Anion gap 8 5 - 15    Comment: Performed at [facility], [address] [place], [place] [address]  Comprehensive metabolic panel     Status: Abnormal   Collection Time: 04/05/21  2:31 PM   Result Value Ref Range   Sodium 138 135 - 145 mmol/L  Potassium 3.6 3.5 - 5.1 mmol/L   Chloride 108 98 - 111 mmol/L   CO2 21 (L) 22 - 32 mmol/L   Glucose, Bld 96 70 - 99 mg/dL    Comment: Glucose reference range applies only to samples taken after fasting for at least 8 hours.   BUN 9 6 - 20 mg/dL   Creatinine, Ser 5.63 (H) 0.44 - 1.00 mg/dL   Calcium 8.5 (L) 8.9 - 10.3 mg/dL   Total Protein 5.0 (L) 6.5 - 8.1 g/dL   Albumin 2.6 (L) 3.5 - 5.0 g/dL   AST 31 15 - 41 U/L   ALT 19 0 - 44 U/L   Alkaline Phosphatase 32 (L) 38 - 126 U/L   Total Bilirubin 2.8 (H) 0.3 - 1.2 mg/dL   GFR, Estimated >14 >97 mL/min    Comment: (NOTE) Calculated using the CKD-EPI Creatinine Equation (2021)    Anion gap 9 5 - 15    Comment: Performed at [facility], [address], [place], [place] [address]  Acetaminophen level     Status: Abnormal   Collection Time: 04/05/21  2:31 PM  Result Value Ref Range   Acetaminophen (Tylenol), Serum <10 (L) 10 - 30 ug/mL    Comment: (NOTE) Therapeutic concentrations vary significantly. A range of 10-30 ug/mL  may be an effective concentration for many patients. However, some  are best treated at concentrations outside of this range. Acetaminophen concentrations >150 ug/mL at 4 hours after ingestion  and >50 ug/mL at 12 hours after ingestion are often associated with  toxic reactions.  Performed at [facility], [address] [place], [place] [address]   Comprehensive metabolic panel     Status: Abnormal   Collection Time: 04/06/21  4:27 AM  Result Value Ref Range   Sodium 137 135 - 145 mmol/L   Potassium 3.6 3.5 - 5.1 mmol/L   Chloride 109 98 - 111 mmol/L   CO2 24 22 - 32 mmol/L   Glucose, Bld 100 (H) 70 - 99 mg/dL    Comment: Glucose reference range applies only to samples taken after fasting for at least 8 hours.   BUN 10 6 - 20 mg/dL   Creatinine, Ser 0.27 0.44 - 1.00 mg/dL   Calcium 8.5 (L) 8.9 - 10.3 mg/dL   Total Protein 5.0 (L) 6.5 -  8.1 g/dL   Albumin 2.6 (L) 3.5 - 5.0 g/dL   AST 27 15 - 41 U/L   ALT 16 0 - 44 U/L   Alkaline Phosphatase 31 (L) 38 - 126 U/L   Total Bilirubin 1.3 (H) 0.3 - 1.2 mg/dL   GFR, Estimated >74 >12 mL/min    Comment: (NOTE) Calculated using the CKD-EPI Creatinine Equation (2021)    Anion gap 4 (L) 5 - 15    Comment: Performed at [facility], [address] [place], [place] [address]  Magnesium     Status: Abnormal   Collection Time: 04/06/21  4:27 AM  Result Value Ref Range   Magnesium 1.6 (L) 1.7 - 2.4 mg/dL    Comment: Performed at [facility], [address] [place], [place] [address]    Current Facility-Administered Medications  Medication Dose Route Frequency Provider Last Rate Last Admin   0.9 %  sodium chloride infusion   Intravenous PRN Lupita Leash, MD   Stopped at 04/05/21 1722   Chlorhexidine Gluconate Cloth 2 % PADS 6 each  6 each Topical Q0600 Max Fickle  B, MD   6 each at 04/04/21 1000   docusate (COLACE) 50 MG/5ML liquid 100 mg  100 mg Per Tube BID PRN Trudee Grip, RPH       heparin injection 5,000 Units  5,000 Units Subcutaneous Q8H Selmer Dominion B, NP   5,000 Units at 04/05/21 1400   levonorgestrel-ethinyl estradiol (SEASONALE) 0.15-0.03 MG per tablet 1 tablet  1 tablet Oral Daily Glade Lloyd, MD   1 tablet at 04/06/21 1113   polyethylene glycol (MIRALAX / GLYCOLAX) packet 17 g  17 g Per Tube Daily PRN Trudee Grip, RPH       sodium chloride flush (NS) 0.9 % injection 10-40 mL  10-40 mL Intracatheter Q12H Max Fickle B, MD   10 mL at 04/06/21 0850   sodium chloride flush (NS) 0.9 % injection 10-40 mL  10-40 mL Intracatheter PRN Lupita Leash, MD          Psychiatric Specialty Exam:  Presentation  General Appearance:  Appropriate for Environment; Well Groomed Eye Contact: Minimal Speech: Slow Speech Volume: Decreased  Mood and Affect  Mood: Depressed Affect: Depressed  Thought Process  Thought  Processes: Goal Directed; Linear Descriptions of Associations:Intact Orientation:Full (Time, Place and Person) Thought Content:Logical History of Schizophrenia/Schizoaffective disorder:No data recorded Duration of Psychotic Symptoms:No data recorded Hallucinations:No data recorded Ideas of Reference:None Suicidal Thoughts:No data recorded Homicidal Thoughts:No data recorded  Sensorium  Memory: Immediate Fair; Recent Fair; Remote Fair Judgment: Poor Insight: Poor  Executive Functions  Concentration: Fair Attention Span: Fair Recall: [employer] of Knowledge: Fair Language: Fair  Psychomotor Activity  Psychomotor Activity: No data recorded  Assets  Assets: Social Support; Desire for Improvement; Communication Skills; Housing  Sleep  Sleep: No data recorded  Physical Exam: Physical Exam Vitals reviewed.  Constitutional:      Appearance: Normal appearance.  HENT:     Head: Normocephalic and atraumatic.  Eyes:     Extraocular Movements: Extraocular movements intact.  Cardiovascular:     Rate and Rhythm: Normal rate and regular rhythm.  Pulmonary:     Effort: Pulmonary effort is normal.     Breath sounds: Normal breath sounds.  Abdominal:     General: Abdomen is flat.     Palpations: Abdomen is soft.  Musculoskeletal:        General: Normal range of motion.     Cervical back: Normal range of motion.  Skin:    General: Skin is warm and dry.  Neurological:     General: No focal deficit present.     Mental Status: She is alert and oriented to person, place, and time.   Review of Systems  Constitutional:  Negative for chills and fever.  Respiratory:  Negative for cough, shortness of breath and wheezing.   Cardiovascular:  Negative for chest pain and palpitations.  Gastrointestinal:  Negative for abdominal pain, nausea and vomiting.  Skin:  Negative for itching and rash.  Neurological:  Negative for dizziness and headaches.  Blood pressure 125/88, pulse  92, temperature 98.2 F (36.8 C), temperature source Oral, resp. rate 16, height 5\' 7"  (1.702 m), weight 96.5 kg, SpO2 100 %. Body mass index is 33.32 kg/m.  Treatment Plan Summary: Pt is a 20 yo female w/ hx of acne vulgaris treated with isotretinoin admitted due to tylenol and ibuprofen overdose. Pt denies present SI/HI/AVH. Medically cleared. Plan for transfer to [facility] once bed is available   MDD vs substance induced mood disorder -awaiting bed for inpatient psychiatric hospitalization -recommend  d/c Accutane as it worsens depression and increases risk for suicidality -no psychiatric medications at this time  Disposition: Recommend psychiatric Inpatient admission when medically cleared. Supportive therapy provided about ongoing stressors.  Thank you for the consult Park Pope, MD PGY1 Psychiatry Resident 04/06/2021 1:57 PM

## 2021-04-06 NOTE — Progress Notes (Signed)
I was assigned to this patient as a NT/Sitter today from 0700-1900. Upon arriving to room, the previous Sitter reported that the patient slept for the majority of night shift, and was very quiet with minimal conversation. Patient was still sleeping until receiving a phone call at about 0800, when she spoke briefly to her Father who Patient reports lives in [place]. She did order breakfast, and was able to eat more than half of her meal. She asked to take a shower, and her Doctor and RN did allow patient to shower, for which she was grateful. She also allowed me to open her blinds, to get some natural sunlight into her room. She had a bowel movement almost immediately following her meal, as has happened every time she eats. She did reports stool is becoming more solid, with some liquid as well (type 5/6). No more emeses is occurring from her meals. She says that every time she eats, she does have to go but it is only the residual charcoal that she is voiding, not the actual food she is eating. She has been very pleasant today, appears to be in a good mood, we have been able to talk, laugh, and work well together this morning. She says her Mother will be here to visit her today, as she has every day, probably this afternoon. No complaints, or tearfulness, she is alert, oriented, and a very sweet and nice patient.

## 2021-04-06 NOTE — TOC Initial Note (Addendum)
Transition of Care [facility]) - Initial/Assessment Note    Patient Details  Name: Elizabeth Brandt MRN: [id number] Date of Birth: 11-03-2000  [facility]) CM/SW Contact:    Erin Sons, LCSW Phone Number: 04/06/2021, 8:51 AM  Clinical Narrative:                  Pt needs inpatient psych and is medically stable. CSW made referral to [facility]; they will review.  1220: CSW notified by [facility] that a bed will be held for pt this afternoon. Voluntary form to be sent to [phone]. Rapid covid has been collected and sent to lab  145: After 2 fax attempts, Voluntary treatment form was not received. CSW notified to send signed copy of form with pt physically. [facility] to notify CSW and bedside RN once bed is available. Pt will transport with [facility].   Expected Discharge Plan: Psychiatric Hospital Barriers to Discharge: Psych Bed not available   Patient Goals and CMS Choice        Expected Discharge Plan and Services Expected Discharge Plan: Psychiatric Hospital                                              Prior Living Arrangements/Services                       Activities of Daily Living      Permission Sought/Granted                  Emotional Assessment           Psych Involvement: Yes (comment)  Admission diagnosis:  Overdose [T50.901A] Acute encephalopathy [G93.40] Intentional acetaminophen overdose, initial encounter (HCC) [T39.1X2A] Respiratory failure, unspecified chronicity, unspecified whether with hypoxia or hypercapnia (HCC) [J96.90] Patient Active Problem List   Diagnosis Date Noted   Overdose 04/03/2021   Encounter for central line placement    PCP:  Patient, No Pcp Per (Inactive) Pharmacy:   [facility] [address] [place] - [address] [place] [address] Phone: [phone] Fax: [phone]     Social Determinants of Health (SDOH) Interventions     Readmission Risk Interventions No flowsheet data found.

## 2021-04-06 NOTE — Progress Notes (Signed)
Attempted to call report at [facility], nurse to give call back. Also attempted to call for safe transportation but no answer at this time.

## 2021-04-06 NOTE — TOC Transition Note (Signed)
Transition of Care [facility]) - CM/SW Discharge Note   Patient Details  Name: Elizabeth Brandt MRN: [id number] Date of Birth: Dec 25, 2000  Transition of Care [facility]) CM/SW Contact:  Erin Sons, LCSW Phone Number: 04/06/2021, 4:11 PM   Clinical Narrative:     Pt should be able to transfer to [facility] this evening. Currently waiting on a covid test. Pt will be transported by [facility]. CSW will provide transport instructions to RN incase he is gone once test results. If before 6pm, transport needs to be arranged by calling [phone] for safe transport to be arranged). For after 6pm transport call [phone]. Pt will be going to [facility] at [address] [place]. Room and report number will be provided to RN by [facility].     Barriers to Discharge: Psych Bed not available   Patient Goals and CMS Choice        Discharge Placement                       Discharge Plan and Services                                     Social Determinants of Health (SDOH) Interventions     Readmission Risk Interventions No flowsheet data found.

## 2021-04-06 NOTE — Progress Notes (Addendum)
Another attempt made to call for safe transportation but no answer. When attempting to leave message for call back, I received a recording stating that voice mail inbox was full and could not accept messages at this time. Will attempt to find alternative phone number or contact in order to transport this patient to [facility].   @2230  Update: Found alternative phone# for "[facility]" at [phone]. Called at 2100, no answer but was able to leave a message. At this time still waiting for call back from 2101 with [facility]. Have updated [facility] of situation and report given to [place] at adult behavior health.

## 2021-04-06 NOTE — Discharge Summary (Signed)
Physician Discharge Summary  Elizabeth Brandt RKY:[id number] DOB: 2000/11/02 DOA: 04/03/2021  PCP: Patient, No Pcp Per (Inactive)  Admit date: 04/03/2021 Discharge date: 04/06/2021  Admitted From: Home Disposition: Inpatient psychiatric hospital  Recommendations for Outpatient Follow-up:  Follow up with provider at inpatient psychiatric hospital at earliest convenience    Home Health: No Equipment/Devices: None  Discharge Condition: Stable CODE STATUS: Full Diet recommendation: Regular  Brief/Interim Summary: 20 year old female with no past medical history was admitted to ICU after intentional overdose of acetaminophen and ibuprofen.  She was obtunded on presentation and was intubated and started on N-acetylcysteine therapy.  She self extubated on 04/04/2021.  She was transferred to [facility] care on 04/05/2021.  She has completed N-acetylcysteine therapy.  Her kidney function and liver function tests are currently stable.  Psychiatry recommended inpatient psychiatric hospitalization once medically stable.  She is medically stable for discharge today.  She will be discharged to inpatient psychiatric hospital once bed is available.  Discharge Diagnoses:   Acute respiratory failure with hypoxia -Possibly from drug overdose.  Intubated on presentation and self extubated on 04/04/2021. -Care has been transferred to [facility] service from 04/05/2021 onwards.  Currently on room air.   Suicide attempt by intentional overdose of acetaminophen and ibuprofen Acute toxic encephalopathy -Patient was obtunded on presentation.  Currently being treated with N-acetylcysteine therapy and per poison control recommendations.  LFTs remain stable. - She has completed N-acetylcysteine therapy.  Her kidney function and liver function tests are currently stable.  Psychiatry recommended inpatient psychiatric hospitalization once medically stable.  She is medically stable for discharge today.  She will be discharged to inpatient  psychiatric hospital once bed is available.   Acute kidney injury -Resolved with IV fluids.  Tolerating diet.  DC IV fluids.   Hypokalemia -Improved  Hypomagnesemia  -Replace prior to discharge   Obesity -Outpatient follow-up  Discharge Instructions  Discharge Instructions     Diet general   Complete by: As directed    Increase activity slowly   Complete by: As directed       Allergies as of 04/06/2021   No Known Allergies      Medication List     STOP taking these medications    Zenatane 40 MG capsule Generic drug: ISOtretinoin       TAKE these medications    Jolessa 0.15-0.03 MG tablet Generic drug: levonorgestrel-ethinyl estradiol Take 1 tablet by mouth daily.        No Known Allergies  Consultations: PCCM/Poison control/psychiatry   Procedures/Studies: CT Head Wo Contrast  Result Date: 04/03/2021 CLINICAL DATA:  20 year old female with altered mental status. EXAM: CT HEAD WITHOUT CONTRAST TECHNIQUE: Contiguous axial images were obtained from the base of the skull through the vertex without intravenous contrast. COMPARISON:  None. FINDINGS: Brain: No evidence of acute infarction, hemorrhage, hydrocephalus, extra-axial collection or mass lesion/mass effect. Vascular: No hyperdense vessel or unexpected calcification. Skull: Normal. Negative for fracture or focal lesion. Sinuses/Orbits: Mild diffuse mucoperiosteal thickening of paranasal sinuses. There is opacification of the nasal passages and nasopharynx. The mastoid air cells are clear. Other: None IMPRESSION: 1. Normal noncontrast CT of the brain. 2. Opacification of the nasal passages and nasopharynx. Electronically Signed   By: Elgie Collard M.D.   On: 04/03/2021 17:14   DG Chest Port 1 View  Result Date: 04/03/2021 CLINICAL DATA:  Status post central line placement. EXAM: PORTABLE CHEST 1 VIEW COMPARISON:  Single-view of the chest earlier today. FINDINGS: The patient is rotated on the exam. New  left IJ approach central venous catheter tip projects near the superior cavoatrial junction. ETT tip is in good position at the clavicular heads. OG tube courses into the stomach and below the inferior margin the film. Lungs are clear. No pneumothorax or pleural fluid. Heart size normal. IMPRESSION: New left IJ central venous catheter tip projects at the superior cavoatrial junction. Negative for pneumothorax. Lungs remain clear. Electronically Signed   By: Drusilla Kanner M.D.   On: 04/03/2021 19:37   DG Chest Portable 1 View  Result Date: 04/03/2021 CLINICAL DATA:  Intubation. EXAM: PORTABLE CHEST 1 VIEW COMPARISON:  May 28, 2019. FINDINGS: The heart size and mediastinal contours are within normal limits. Both lungs are clear. Endotracheal and nasogastric tubes appear to be in grossly good position. The visualized skeletal structures are unremarkable. IMPRESSION: Endotracheal and nasogastric tubes are in grossly good position. No acute cardiopulmonary abnormality seen. Electronically Signed   By: Lupita Raider M.D.   On: 04/03/2021 15:37   EEG adult  Result Date: 04/03/2021 Charlsie Quest, MD     04/03/2021  9:01 PM [name] MRN: [id number] Epilepsy Attending: Charlsie Quest Referring Physician/Provider: Dr Lupita Leash, Date: 04/03/2021 Duration: 28.51 mins Patient history: 19yo s/p intentional overdose. EEG to evaluate for seizure Level of alertness:  comatose AEDs during EEG study: Propofol Technical aspects: This EEG study was done with scalp electrodes positioned according to the 10-20 International system of electrode placement. Electrical activity was acquired at a sampling rate of 500Hz  and reviewed with a high frequency filter of 70Hz  and a low frequency filter of 1Hz . EEG data were recorded continuously and digitally stored. Description: EEG showed continuous generalized 2-3Hz  delta slowing admixed with an excessive amount of 15 to 18 Hz beta activity  distributed symmetrically and diffusely. Hyperventilation and photic stimulation were not performed.   ABNORMALITY - Continuous slow, generalized - Excessive beta, generalized IMPRESSION: This study is suggestive of severe diffuse encephalopathy, nonspecific etiology but likely related to sedation. The excessive beta activity seen in the background is most likely due to the effect of benzodiazepine and is a benign EEG pattern. No seizures or epileptiform discharges were seen throughout the recording. Priyanka      Subjective: Patient seen and examined at bedside.  She feels much better and is tolerating diet.  No overnight fever, vomiting, worsening shortness of breath reported.  Discharge Exam: Vitals:   04/06/21 0520 04/06/21 0851  BP: 121/73 125/88  Pulse: 87 92  Resp: 14 16  Temp: 98.2 F (36.8 C)   SpO2: 100% 100%    General: Pt is alert, awake, not in acute distress.  Currently on room air. Cardiovascular: rate controlled, S1/S2 + Respiratory: bilateral decreased breath sounds at bases Abdominal: Soft, obese, NT, ND, bowel sounds + Extremities: no edema, no cyanosis    The results of significant diagnostics from this hospitalization (including imaging, microbiology, ancillary and laboratory) are listed below for reference.     Microbiology: Recent Results (from the past 240 hour(s))  MRSA Next Gen by PCR, Nasal     Status: None   Collection Time: 04/03/21  7:05 PM   Specimen: Nasal Mucosa; Nasal Swab  Result Value Ref Range Status   MRSA by PCR Next Gen NOT DETECTED NOT DETECTED Final    Comment: (NOTE) The GeneXpert MRSA Assay (FDA approved for NASAL specimens only), is one component of a comprehensive MRSA colonization surveillance program. It is not intended to diagnose MRSA infection nor  to guide or monitor treatment for MRSA infections. Test performance is not FDA approved in patients less than 634 years old. Performed at [facility], [address] [place], [place] [address]      Labs: BNP (last 3 results) No results for input(s): BNP in the last 8760 hours. Basic Metabolic Panel: Recent Labs  Lab 04/03/21 1510 04/03/21 1544 04/04/21 0700 04/04/21 1516 04/05/21 0406 04/05/21 1431 04/06/21 0427  NA  --    < > 142 141 138 138 137  K  --    < > 2.6* 3.3* 3.1* 3.6 3.6  CL  --    < > 108 107 105 108 109  CO2  --    < > 20* 24 25 21* 24  GLUCOSE  --    < > 150* 113* 106* 96 100*  BUN  --    < > 7 9 8 9 10   CREATININE  --    < > 1.47* 1.85* 1.53* 1.03* 0.96  CALCIUM  --    < > 8.0* 8.0* 8.2* 8.5* 8.5*  MG 2.2  --   --   --   --   --  1.6*  PHOS 3.1  --   --   --   --   --   --    < > = values in this interval not displayed.   Liver Function Tests: Recent Labs  Lab 04/04/21 0700 04/04/21 1516 04/05/21 0406 04/05/21 1431 04/06/21 0427  AST 22 26 22 31 27   ALT 19 22 17 19 16   ALKPHOS 36* 35* 31* 32* 31*  BILITOT 0.6 1.8* 2.8* 2.8* 1.3*  PROT 5.8* 5.9* 5.2* 5.0* 5.0*  ALBUMIN 3.0* 3.1* 2.7* 2.6* 2.6*   No results for input(s): LIPASE, AMYLASE in the last 168 hours. Recent Labs  Lab 04/03/21 1827  AMMONIA 37*   CBC: Recent Labs  Lab 04/03/21 1506 04/03/21 1544 04/03/21 2046 04/04/21 0251  WBC 7.5  --   --  6.0  NEUTROABS 5.2  --   --   --   HGB 12.4 13.3 14.6 12.4  HCT 42.6 39.0 43.0 40.2  MCV 85.0  --   --  80.6  PLT 277  --   --  300   Cardiac Enzymes: No results for input(s): CKTOTAL, CKMB, CKMBINDEX, TROPONINI in the last 168 hours. BNP: Invalid input(s): POCBNP CBG: Recent Labs  Lab 04/04/21 0739 04/04/21 1227 04/04/21 1507 04/04/21 1912 04/04/21 2304  GLUCAP 142* 117* 105* 119* 112*   D-Dimer No results for input(s): DDIMER in the last 72 hours. Hgb A1c No results for input(s): HGBA1C in the last 72 hours. Lipid Profile No results for input(s): CHOL, HDL, LDLCALC, TRIG, CHOLHDL, LDLDIRECT in the last 72 hours. Thyroid function studies No results for input(s): TSH, T4TOTAL,  T3FREE, THYROIDAB in the last 72 hours.  Invalid input(s): FREET3 Anemia work up No results for input(s): VITAMINB12, FOLATE, FERRITIN, TIBC, IRON, RETICCTPCT in the last 72 hours. Urinalysis    Component Value Date/Time   COLORURINE YELLOW 04/04/2021 0251   APPEARANCEUR CLEAR 04/04/2021 0251   LABSPEC 1.018 04/04/2021 0251   PHURINE 5.0 04/04/2021 0251   GLUCOSEU NEGATIVE 04/04/2021 0251   HGBUR NEGATIVE 04/04/2021 0251   BILIRUBINUR NEGATIVE 04/04/2021 0251   KETONESUR 80 (A) 04/04/2021 0251   PROTEINUR 30 (A) 04/04/2021 0251   NITRITE NEGATIVE 04/04/2021 0251   LEUKOCYTESUR NEGATIVE 04/04/2021 0251   Sepsis Labs Invalid input(s): PROCALCITONIN,  WBC,  LACTICIDVEN Microbiology Recent Results (from the past 240 hour(s))  MRSA Next Gen by PCR, Nasal     Status: None   Collection Time: 04/03/21  7:05 PM   Specimen: Nasal Mucosa; Nasal Swab  Result Value Ref Range Status   MRSA by PCR Next Gen NOT DETECTED NOT DETECTED Final    Comment: (NOTE) The GeneXpert MRSA Assay (FDA approved for NASAL specimens only), is one component of a comprehensive MRSA colonization surveillance program. It is not intended to diagnose MRSA infection nor to guide or monitor treatment for MRSA infections. Test performance is not FDA approved in patients less than 2 years old. Performed at [facility], [address] [place], [place] [address]      Time coordinating discharge: 35 minutes  SIGNED:   Glade Lloyd, MD  [facility] 04/06/2021, 9:35 AM

## 2021-04-07 ENCOUNTER — Encounter (HOSPITAL_COMMUNITY): Payer: Self-pay | Admitting: Psychiatry

## 2021-04-07 ENCOUNTER — Inpatient Hospital Stay (HOSPITAL_COMMUNITY)
Admission: AD | Admit: 2021-04-07 | Discharge: 2021-04-10 | DRG: 918 | Disposition: A | Discharge status: Home / Self Care | Payer: 59 | Source: Intra-hospital | Attending: Psychiatry | Admitting: Psychiatry

## 2021-04-07 ENCOUNTER — Other Ambulatory Visit: Payer: Self-pay

## 2021-04-07 DIAGNOSIS — F1994 Other psychoactive substance use, unspecified with psychoactive substance-induced mood disorder: Secondary | ICD-10-CM | POA: Diagnosis present

## 2021-04-07 DIAGNOSIS — F419 Anxiety disorder, unspecified: Secondary | ICD-10-CM | POA: Diagnosis present

## 2021-04-07 DIAGNOSIS — T391X2A Poisoning by 4-Aminophenol derivatives, intentional self-harm, initial encounter: Secondary | ICD-10-CM | POA: Diagnosis present

## 2021-04-07 DIAGNOSIS — Y92009 Unspecified place in unspecified non-institutional (private) residence as the place of occurrence of the external cause: Secondary | ICD-10-CM | POA: Diagnosis not present

## 2021-04-07 DIAGNOSIS — F329 Major depressive disorder, single episode, unspecified: Secondary | ICD-10-CM | POA: Diagnosis present

## 2021-04-07 DIAGNOSIS — T50902A Poisoning by unspecified drugs, medicaments and biological substances, intentional self-harm, initial encounter: Secondary | ICD-10-CM | POA: Diagnosis present

## 2021-04-07 LAB — URINALYSIS, COMPLETE (UACMP) WITH MICROSCOPIC
Bilirubin Urine: NEGATIVE
Glucose, UA: NEGATIVE mg/dL
Hgb urine dipstick: NEGATIVE
Ketones, ur: NEGATIVE mg/dL
Leukocytes,Ua: NEGATIVE
Nitrite: NEGATIVE
Protein, ur: 30 mg/dL — AB
Specific Gravity, Urine: 1.02 (ref 1.005–1.030)
pH: 6 (ref 5.0–8.0)

## 2021-04-07 LAB — PREGNANCY, URINE: Preg Test, Ur: NEGATIVE

## 2021-04-07 LAB — ACETAMINOPHEN LEVEL: Acetaminophen (Tylenol), Serum: <10 ug/mL — ABNORMAL LOW (ref 10–30)

## 2021-04-07 MED ORDER — HYDROXYZINE HCL 25 MG PO TABS
25.0000 mg | ORAL_TABLET | Freq: Three times a day (TID) | ORAL | Status: DC | PRN
  Filled 2021-04-07: qty 1

## 2021-04-07 MED ORDER — TRAZODONE HCL 50 MG PO TABS
50.0000 mg | ORAL_TABLET | Freq: Every evening | ORAL | Status: DC | PRN
  Filled 2021-04-07: qty 1

## 2021-04-07 MED ORDER — ACETAMINOPHEN 325 MG PO TABS: 650.0000 mg | ORAL_TABLET | Freq: Four times a day (QID) | ORAL | Status: DC | PRN

## 2021-04-07 MED ORDER — MAGNESIUM HYDROXIDE 400 MG/5ML PO SUSP: 30.0000 mL | Freq: Every day | ORAL | Status: DC | PRN

## 2021-04-07 MED ORDER — HEPARIN SODIUM (PORCINE) 5000 UNIT/ML IJ SOLN: 5000.0000 [IU] | Freq: Three times a day (TID) | INTRAMUSCULAR | Status: DC

## 2021-04-07 MED ORDER — ALUM & MAG HYDROXIDE-SIMETH 200-200-20 MG/5ML PO SUSP: 30.0000 mL | ORAL | Status: DC | PRN

## 2021-04-07 NOTE — Tx Team (Signed)
Initial Treatment Plan 04/07/2021 6:32 PM [name] MCN:[id number]    PATIENT STRESSORS: Loss of significant relationship   PATIENT STRENGTHS: Average or above average intelligence Communication skills Physical Health Supportive family/friends Work skills   PATIENT IDENTIFIED PROBLEMS: " How to cope and manage my feelings"  2. " How to be productive when alone"                   DISCHARGE CRITERIA:  Improved stabilization in mood, thinking, and/or behavior Need for constant or close observation no longer present Reduction of life-threatening or endangering symptoms to within safe limits  PRELIMINARY DISCHARGE PLAN: Return to previous living arrangement Return to previous work or school arrangements  PATIENT/FAMILY INVOLVEMENT: This treatment plan has been presented to and reviewed with the patient, Elizabeth Brandt.  The patient and family have been given the opportunity to ask questions and make suggestions.  Bethena Midget, RN 04/07/2021, 6:32 PM

## 2021-04-07 NOTE — TOC Transition Note (Signed)
Transition of Care [facility]) - CM/SW Discharge Note   Patient Details  Name: Elizabeth Brandt MRN: [id number] Date of Birth: 2001/07/20  [facility] CM/SW Contact:  Jimmy Picket, LCSW Phone Number: 04/07/2021, 10:41 AM   Clinical Narrative:     Pt will discharge to [facility].  Pt would like to wait to transfer until her mom gets here with her clothes at about 1pm. CSW will call for safe transport at that time. Safe transports number is  [phone].  Nurse will need to call report to [phone].    Barriers to Discharge: Psych Bed not available   Patient Goals and CMS Choice        Discharge Placement                       Discharge Plan and Services                                     Social Determinants of Health (SDOH) Interventions     Readmission Risk Interventions No flowsheet data found.   Jimmy Picket, Theresia Majors, [place] Clinical Social Worker [phone]

## 2021-04-07 NOTE — Progress Notes (Signed)
Admission Note:  20 yr female who presents in no acute distress from [place] [address] [place]. She was admitted on 7/26 for the treatment of Depression and intention overdose of acetaminophen and ibuprofen. Patient was intubated on 7/26 and extubated on 7/27. She is animated and assertive. Currently denies suicidal ideation. Pt was calm and cooperative with admission process. Contracts for safety upon admission. Pt denies AVH . Skin was assessed and found to be clear of any abnormal marks. PT searched and no contraband found, POC and unit policies explained and understanding verbalized. Consents obtained. Pt had no additional questions or concerns. Meet with mother for brief moment.

## 2021-04-07 NOTE — [facility] Group Notes (Signed)
Pt did not attend the meditation group. 

## 2021-04-07 NOTE — Progress Notes (Signed)
Patient ID: Elizabeth Brandt, female   DOB: 07-04-2001, 20 y.o.   MRN: [id number] Patient is waiting to be discharged to [facility].  She is currently medically stable for discharge.  Patient seen at bedside.  Please refer to the full discharge summary done by me on 04/06/2021 for full details.

## 2021-04-07 NOTE — Progress Notes (Signed)
Patient rated her day as a 7 out of 10. Patient states that she is feeling anxious and is trying to move forward. Her goal for tomorrow is to work on her coping skills.

## 2021-04-07 NOTE — Progress Notes (Signed)
Report called to Elmarie Shiley, @ [facility] @ [phone]. Pt to be transferred by safe transport, per social work

## 2021-04-07 NOTE — Progress Notes (Signed)
Pt to be d/c to [facility], at this time , pt is alert and oriented, no acute distress noted Pt denies any SI/HI ideations

## 2021-04-08 DIAGNOSIS — F1994 Other psychoactive substance use, unspecified with psychoactive substance-induced mood disorder: Secondary | ICD-10-CM

## 2021-04-08 LAB — COMPREHENSIVE METABOLIC PANEL
ALT: 40 U/L (ref 0–44)
AST: 38 U/L (ref 15–41)
Albumin: 3.5 g/dL (ref 3.5–5.0)
Alkaline Phosphatase: 40 U/L (ref 38–126)
Anion gap: 7 (ref 5–15)
BUN: 15 mg/dL (ref 6–20)
CO2: 24 mmol/L (ref 22–32)
Calcium: 9.1 mg/dL (ref 8.9–10.3)
Chloride: 108 mmol/L (ref 98–111)
Creatinine, Ser: 0.92 mg/dL (ref 0.44–1.00)
GFR, Estimated: >60 mL/min (ref >60)
Glucose, Bld: 95 mg/dL (ref 70–99)
Potassium: 3.7 mmol/L (ref 3.5–5.1)
Sodium: 139 mmol/L (ref 135–145)
Total Bilirubin: 0.5 mg/dL (ref 0.3–1.2)
Total Protein: 6.8 g/dL (ref 6.5–8.1)

## 2021-04-08 LAB — PROTIME-INR
INR: 0.9 (ref 0.8–1.2)
Prothrombin Time: 12.5 seconds (ref 11.4–15.2)

## 2021-04-08 NOTE — H&P (Signed)
Psychiatric Admission Assessment Adult  Patient Identification: Elizabeth Brandt MRN:  [id number] Date of Evaluation:  04/08/2021 Chief Complaint:  MDD (major depressive disorder) [F32.9] Intentional overdose of drug in tablet form (HCC) [T50.902A] Principal Diagnosis: <principal problem not specified> Diagnosis:  Active Problems:   MDD (major depressive disorder)   Intentional overdose of drug in tablet form (HCC)  History of Present Illness: Patient is seen and examined.  Patient is a 20 year old female with an essentially negative past psychiatric history who originally presented to the [facility] emergency department on 04/03/2021 after having been found by emergency medical services unresponsive.  The patient's mother reported that the patient had last been seen at 9 AM on that date.  The mother reported that the patient had broken up with a significant other recently, and an empty bottle of Tylenol as well as Duexis and hemp lotion was found.  Upon arrival to the emergency department she was unresponsive and vomiting.  She was intubated to protect her airway.  On evaluation in the emergency department collateral information revealed that her daughter had been saying strange things lately, and had broken up with her boyfriend.  The boyfriend texted the mother asking that she check on the daughter and that was when she was found unresponsive.  There was no suicide note.  She was admitted to the critical care unit.  Her Tylenol level on admission was 110.  She was given N-acetylcysteine.  While she was intubated she had an EEG done that showed continuous slow wave as well as excessive beta wave problems suggesting severe diffuse encephalopathy.  She was successfully extubated on 7/27.  This was a self extubation.  Psychiatric consultation was done on 04/04/2021.  During that evaluation she was quite regretful, and did not realize that "so many people cared about me".  The decision to transfer to the  psychiatric hospital was made and she was transferred on 04/07/2021.  On evaluation this morning the patient denied any previous history of psychiatric illness or psychiatric treatment.  She admitted that she had been treated with Accutane.  She had started this approximately in February.  She stated that her severe depressive symptoms started approximately 1 to 2 weeks prior to her suicide attempt.  Today she reports that her mood is approximately a 7 out of 10.  She denied suicidal ideation.  Prior to February she denied any previous history of helplessness, hopelessness or worthlessness.  She denied any previous sexual or emotional trauma.  She denied any previous psychiatric treatment.  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  She was admitted to our facility for evaluation and stabilization.  Associated Signs/Symptoms: Depression Symptoms:  depressed mood, anhedonia, fatigue, feelings of worthlessness/guilt, hopelessness, suicidal attempt, disturbed sleep, Duration of Depression Symptoms: No data recorded (Hypo) Manic Symptoms:  Impulsivity, Anxiety Symptoms:  Excessive Worry, Psychotic Symptoms:   Denied PTSD Symptoms: Negative Total Time spent with patient: 30 minutes  Past Psychiatric History: Patient denied any previous psychiatric medications, psychiatric evaluations or psychiatric treatment in the past.  Is the patient at risk to self? No.  Has the patient been a risk to self in the past 6 months? No.  Has the patient been a risk to self within the distant past? No.  Is the patient a risk to others? No.  Has the patient been a risk to others in the past 6 months? No.  Has the patient been a risk to others within the distant past? No.   Prior Inpatient Therapy:  Prior Outpatient Therapy:    Alcohol Screening: Patient refused Alcohol Screening Tool: Yes 1. How often do you have a drink containing alcohol?: Never 2. How many drinks containing alcohol do you have on a typical day when  you are drinking?: 1 or 2 3. How often do you have six or more drinks on one occasion?: Never AUDIT-C Score: 0 4. How often during the last year have you found that you were not able to stop drinking once you had started?: Never 5. How often during the last year have you failed to do what was normally expected from you because of drinking?: Never 6. How often during the last year have you needed a first drink in the morning to get yourself going after a heavy drinking session?: Never 7. How often during the last year have you had a feeling of guilt of remorse after drinking?: Never 8. How often during the last year have you been unable to remember what happened the night before because you had been drinking?: Never 9. Have you or someone else been injured as a result of your drinking?: No 10. Has a relative or friend or a doctor or another health worker been concerned about your drinking or suggested you cut down?: No Alcohol Use Disorder Identification Test Final Score (AUDIT): 0 Substance Abuse History in the last 12 months:  No. Consequences of Substance Abuse: Negative Previous Psychotropic Medications: No  Psychological Evaluations: No  Past Medical History: History reviewed. No pertinent past medical history. History reviewed. No pertinent surgical history. Family History: History reviewed. No pertinent family history. Family Psychiatric  History: Patient denied any family history of psychiatric illness, substance abuse or suicide attempts. Tobacco Screening:   Social History:  Social History   Substance and Sexual Activity  Alcohol Use Never     Social History   Substance and Sexual Activity  Drug Use Never    Additional Social History:                           Allergies:  No Known Allergies Lab Results:  Results for orders placed or performed during the hospital encounter of 04/07/21 (from the past 48 hour(s))  Acetaminophen level     Status: Abnormal    Collection Time: 04/07/21  6:17 PM  Result Value Ref Range   Acetaminophen (Tylenol), Serum <10 (L) 10 - 30 ug/mL    Comment: (NOTE) Therapeutic concentrations vary significantly. A range of 10-30 ug/mL  may be an effective concentration for many patients. However, some  are best treated at concentrations outside of this range. Acetaminophen concentrations >150 ug/mL at 4 hours after ingestion  and >50 ug/mL at 12 hours after ingestion are often associated with  toxic reactions.  Performed at [facility], [address] [place], [place] [address]   Urinalysis, Complete w Microscopic Urine, Unspecified Source     Status: Abnormal   Collection Time: 04/07/21  6:37 PM  Result Value Ref Range   Color, Urine YELLOW YELLOW   APPearance HAZY (A) CLEAR   Specific Gravity, Urine 1.020 1.005 - 1.030   pH 6.0 5.0 - 8.0   Glucose, UA NEGATIVE NEGATIVE mg/dL   Hgb urine dipstick NEGATIVE NEGATIVE   Bilirubin Urine NEGATIVE NEGATIVE   Ketones, ur NEGATIVE NEGATIVE mg/dL   Protein, ur 30 (A) NEGATIVE mg/dL   Nitrite NEGATIVE NEGATIVE   Leukocytes,Ua NEGATIVE NEGATIVE   RBC / HPF 0-5 0 - 5  RBC/hpf   WBC, UA 0-5 0 - 5 WBC/hpf   Bacteria, UA RARE (A) NONE SEEN   Squamous Epithelial / LPF 0-5 0 - 5   Mucus PRESENT     Comment: Performed at [facility], [address] [place], [place] [address]  Pregnancy, urine     Status: None   Collection Time: 04/07/21  6:37 PM  Result Value Ref Range   Preg Test, Ur NEGATIVE NEGATIVE    Comment:        THE SENSITIVITY OF THIS METHODOLOGY IS >20 mIU/mL. Performed at [facility], [address], [place], [place] [address]   Comprehensive metabolic panel     Status: None   Collection Time: 04/08/21  6:15 AM  Result Value Ref Range   Sodium 139 135 - 145 mmol/L   Potassium 3.7 3.5 - 5.1 mmol/L   Chloride 108 98 - 111 mmol/L   CO2 24 22 - 32 mmol/L   Glucose, Bld 95 70 - 99 mg/dL    Comment:  Glucose reference range applies only to samples taken after fasting for at least 8 hours.   BUN 15 6 - 20 mg/dL   Creatinine, Ser 4.12 0.44 - 1.00 mg/dL   Calcium 9.1 8.9 - 87.8 mg/dL   Total Protein 6.8 6.5 - 8.1 g/dL   Albumin 3.5 3.5 - 5.0 g/dL   AST 38 15 - 41 U/L   ALT 40 0 - 44 U/L   Alkaline Phosphatase 40 38 - 126 U/L   Total Bilirubin 0.5 0.3 - 1.2 mg/dL   GFR, Estimated >67 >67 mL/min    Comment: (NOTE) Calculated using the CKD-EPI Creatinine Equation (2021)    Anion gap 7 5 - 15    Comment: Performed at [facility], [address], [place], [place] [address]  Protime-INR     Status: None   Collection Time: 04/08/21  6:15 AM  Result Value Ref Range   Prothrombin Time 12.5 11.4 - 15.2 seconds   INR 0.9 0.8 - 1.2    Comment: (NOTE) INR goal varies based on device and disease states. Performed at [facility], [address] [place], [place] [address]     Blood Alcohol level:  Lab Results  Component Value Date   ETH <10 04/03/2021    Metabolic Disorder Labs:  No results found for: HGBA1C, MPG No results found for: PROLACTIN No results found for: CHOL, TRIG, HDL, CHOLHDL, VLDL, LDLCALC  Current Medications: Current Facility-Administered Medications  Medication Dose Route Frequency Provider Last Rate Last Admin   acetaminophen (TYLENOL) tablet 650 mg  650 mg Oral Q6H PRN Antonieta Pert, MD       alum & mag hydroxide-simeth (MAALOX/MYLANTA) 200-200-20 MG/5ML suspension 30 mL  30 mL Oral Q4H PRN Antonieta Pert, MD       hydrOXYzine (ATARAX/VISTARIL) tablet 25 mg  25 mg Oral TID PRN Antonieta Pert, MD       magnesium hydroxide (MILK OF MAGNESIA) suspension 30 mL  30 mL Oral Daily PRN Antonieta Pert, MD       traZODone (DESYREL) tablet 50 mg  50 mg Oral QHS PRN Antonieta Pert, MD       PTA Medications: Medications Prior to Admission  Medication Sig Dispense Refill Last Dose   JOLESSA 0.15-0.03 MG tablet  Take 1 tablet by mouth daily.       Musculoskeletal: Strength & Muscle Tone: within normal limits Gait &  Station: normal Patient leans: N/A            Psychiatric Specialty Exam:  Presentation  General Appearance: Fairly Groomed  Eye Contact:Fair  Speech:Normal Rate  Speech Volume:Normal  Handedness:Right   Mood and Affect  Mood:Anxious  Affect:Congruent   Thought Process  Thought Processes:Coherent  Duration of Psychotic Symptoms: No data recorded Past Diagnosis of Schizophrenia or Psychoactive disorder: No data recorded Descriptions of Associations:Intact  Orientation:Full (Time, Place and Person)  Thought Content:Logical  Hallucinations:Hallucinations: None  Ideas of Reference:None  Suicidal Thoughts:Suicidal Thoughts: No  Homicidal Thoughts:Homicidal Thoughts: No   Sensorium  Memory:Immediate Good; Recent Good; Remote Good  Judgment:Good  Insight:Good   Executive Functions  Concentration:Good  Attention Span:Good  Recall:Good  Fund of Knowledge:Good  Language:Good   Psychomotor Activity  Psychomotor Activity:Psychomotor Activity: Normal   Assets  Assets:Communication Skills; Desire for Improvement; Financial Resources/Insurance; Housing; Resilience; Social Support; Talents/Skills; Transportation; Vocational/Educational   Sleep  Sleep:Sleep: Good Number of Hours of Sleep: 6.75    Physical Exam: Physical Exam Vitals and nursing note reviewed.  Constitutional:      Appearance: Normal appearance.  HENT:     Head: Normocephalic and atraumatic.  Pulmonary:     Effort: Pulmonary effort is normal.  Neurological:     General: No focal deficit present.     Mental Status: She is alert and oriented to person, place, and time.   Review of Systems  All other systems reviewed and are negative. Blood pressure 125/82, pulse (!) 120, temperature 98.6 F (37 C), temperature source Oral, resp. rate 18, height 5\' 8"  (1.727 m),  weight 95.7 kg, SpO2 99 %. Body mass index is 32.08 kg/m.  Treatment Plan Summary: Patient is seen and examined.  Patient is a 20 year old female with the above-stated past psychiatric history who was transferred to our facility after an intentional overdose of ibuprofen and Tylenol.  She will be admitted to the unit.  She will be integrated in the milieu.  She will be encouraged to attend groups.  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  We will need to get collateral information from her mother, and find out about the relationship issues.  On my discussion today she discusses her boyfriend as though the relationship is still intact.  She is declined medication at this time, and stated she want to work on her coping skills.  Her mood and affect today are quite normal.  She will have available hydroxyzine for anxiety as well as trazodone for sleep as needed.  Review of her most recent laboratories include electrolytes from 7/31.  Her creatinine was 0.92 and her liver function enzymes were normal with an AST of 38 and an ALT of 40.  Her ammonia from 7/26 was 37.  CBC from 7/27 was essentially normal.  Differential was normal.  A PT and INR from 7/31 was normal with a PT of 12.5 and an INR of 0.9.  Her acetaminophen level from 7/30 was less than 10.  Respiratory panel for influenza A, B and coronavirus were negative.  Her HIV was negative.  Her pregnancy test from 7/30 was negative.  Urinalysis from 7/30 showed 30 mg per DL of protein but otherwise negative.  Alcohol and alcohol derivatives were all negative from 7/26.  Drug screen was negative.  MRSA screen was negative.  She had a CT scan of the head from 7/26 that was essentially normal except for opacification of  the nasal passages and nasopharynx.  Portable chest x-ray from 7/26 showed a left intrajugular approach central line that was placed.  Her ETT tip was in good position.   OG tube coursed into the stomach and below the inferior margin of the film.  Lungs were clear.  Her vital signs are stable, she is afebrile.  We discussed today the necessity of psychiatric follow-up as well as therapy follow-up after the hospitalization.  As stated above we will get the collateral information from her mother with regard to any previous suicide attempts or depressive illness prior to the Accutane.  Observation Level/Precautions:  15 minute checks  Laboratory:  CBC Chemistry Profile HbAIC HCG UDS UA  Psychotherapy:    Medications:    Consultations:    Discharge Concerns:    Estimated LOS:  Other:     Physician Treatment Plan for Primary Diagnosis: <principal problem not specified> Long Term Goal(s): Improvement in symptoms so as ready for discharge  Short Term Goals: Ability to identify changes in lifestyle to reduce recurrence of condition will improve, Ability to verbalize feelings will improve, Ability to disclose and discuss suicidal ideas, Ability to demonstrate self-control will improve, Ability to identify and develop effective coping behaviors will improve, and Ability to maintain clinical measurements within normal limits will improve  Physician Treatment Plan for Secondary Diagnosis: Active Problems:   MDD (major depressive disorder)   Intentional overdose of drug in tablet form (HCC)  Long Term Goal(s): Improvement in symptoms so as ready for discharge  Short Term Goals: Ability to identify changes in lifestyle to reduce recurrence of condition will improve, Ability to verbalize feelings will improve, Ability to disclose and discuss suicidal ideas, Ability to demonstrate self-control will improve, Ability to identify and develop effective coping behaviors will improve, and Ability to maintain clinical measurements within normal limits will improve  I certify that inpatient services furnished can reasonably be expected to improve the patient's condition.     Antonieta Pert, MD 7/31/20223:10 PM

## 2021-04-08 NOTE — Progress Notes (Signed)
   04/07/21 1945  Psych Admission Type (Psych Patients Only)  Admission Status Voluntary  Psychosocial Assessment  Patient Complaints None  Eye Contact Fair  Facial Expression Flat  Affect Depressed  Speech Logical/coherent  Interaction No initiation;Minimal  Motor Activity Slow  Appearance/Hygiene In scrubs  Behavior Characteristics Appropriate to situation;Cooperative  Mood Threatening;Worthless, low self-esteem  Thought Process  Coherency WDL  Content WDL  Delusions None reported or observed  Perception WDL  Hallucination None reported or observed  Judgment Poor  Confusion None  Danger to Self  Current suicidal ideation? Denies  Danger to Others  Danger to Others None reported or observed

## 2021-04-08 NOTE — [facility] Group Notes (Signed)
[facility] LCSW Group Therapy Note  04/08/2021    Type of Therapy and Topic:  Group Therapy:  A Hero Worthy of Support  Participation Level:  Minimal   Description of Group:  Patients in this group were introduced to the concept that additional supports including self-support are an essential part of recovery.  Matching needs with supports to help fulfill those needs was explained.  Establishing boundaries that can gradually be increased or decreased was described, with patients giving their own examples of establishing appropriate boundaries in their lives.  A song entitled "My Own Hero" was played and a group discussion ensued in which patients stated it inspired them to help themselves in order to succeed, because other people cannot achieve their goals such as sobriety or stability for them.  A song was played called "I Am Enough" which led to a discussion about being willing to believe we are worth the effort of being a self-support.   Therapeutic Goals: 1)  demonstrate the importance of being a key part of one's own support system 2)  discuss various available supports 3)  encourage patient to use music as part of their self-support and focus on goals 4)  elicit ideas from patients about supports that need to be added   Summary of Patient Progress:  The patient expressed that her healthy supports include her mother and family, while unhealthy supports include herself.  She did not talk any more in group but was attentive.  Therapeutic Modalities:   Motivational Interviewing Activity  Lynnell Chad

## 2021-04-08 NOTE — [facility] Suicide Risk Assessment (Signed)
[facility] Admission Suicide Risk Assessment   Nursing information obtained from:  Patient Demographic factors:  Adolescent or young adult Current Mental Status:  NA (Denies current suicidal thoughts) Loss Factors:  Loss of significant relationship Historical Factors:  NA Risk Reduction Factors:  Employed, Living with another person, especially a relative, Positive social support  Total Time spent with patient: 30 minutes Principal Problem: <principal problem not specified> Diagnosis:  Active Problems:   MDD (major depressive disorder)   Intentional overdose of drug in tablet form (HCC)  Subjective Data: Patient is seen and examined.  Patient is a 20 year old female with an essentially negative past psychiatric history who originally presented to the [facility] emergency department on 04/03/2021 after having been found by emergency medical services unresponsive.  The patient's mother reported that the patient had last been seen at 9 AM on that date.  The mother reported that the patient had broken up with a significant other recently, and an empty bottle of Tylenol as well as Duexis and hemp lotion was found.  Upon arrival to the emergency department she was unresponsive and vomiting.  She was intubated to protect her airway.  On evaluation in the emergency department collateral information revealed that her daughter had been saying strange things lately, and had broken up with her boyfriend.  The boyfriend texted the mother asking that she check on the daughter and that was when she was found unresponsive.  There was no suicide note.  She was admitted to the critical care unit.  Her Tylenol level on admission was 110.  She was given N-acetylcysteine.  While she was intubated she had an EEG done that showed continuous slow wave as well as excessive beta wave problems suggesting severe diffuse encephalopathy.  She was successfully extubated on 7/27.  This was a self extubation.  Psychiatric consultation was  done on 04/04/2021.  During that evaluation she was quite regretful, and did not realize that "so many people cared about me".  The decision to transfer to the psychiatric hospital was made and she was transferred on 04/07/2021.  On evaluation this morning the patient denied any previous history of psychiatric illness or psychiatric treatment.  She admitted that she had been treated with Accutane.  She had started this approximately in February.  She stated that her severe depressive symptoms started approximately 1 to 2 weeks prior to her suicide attempt.  Today she reports that her mood is approximately a 7 out of 10.  She denied suicidal ideation.  Prior to February she denied any previous history of helplessness, hopelessness or worthlessness.  She denied any previous sexual or emotional trauma.  She denied any previous psychiatric treatment.  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  She was admitted to our facility for evaluation and stabilization.  Continued Clinical Symptoms:  Alcohol Use Disorder Identification Test Final Score (AUDIT): 0 The "Alcohol Use Disorders Identification Test", Guidelines for Use in Primary Care, Second Edition.  World Science writer [facility]). Score between 0-7:  no or low risk or alcohol related problems. Score between 8-15:  moderate risk of alcohol related problems. Score between 16-19:  high risk of alcohol related problems. Score 20 or above:  warrants further diagnostic evaluation for alcohol dependence and treatment.   CLINICAL FACTORS:   Depression:   Impulsivity   Musculoskeletal: Strength & Muscle Tone: within normal limits Gait & Station: normal Patient leans: N/A  Psychiatric Specialty Exam:  Presentation  General Appearance: Fairly Groomed  Eye Contact:Fair  Speech:Normal  Rate  Speech Volume:Normal  Handedness:Right   Mood and Affect  Mood:Anxious  Affect:Congruent   Thought Process  Thought Processes:Coherent  Descriptions of  Associations:Intact  Orientation:Full (Time, Place and Person)  Thought Content:Logical  History of Schizophrenia/Schizoaffective disorder:No data recorded Duration of Psychotic Symptoms:No data recorded Hallucinations:Hallucinations: None  Ideas of Reference:None  Suicidal Thoughts:Suicidal Thoughts: No  Homicidal Thoughts:Homicidal Thoughts: No   Sensorium  Memory:Immediate Good; Recent Good; Remote Good  Judgment:Good  Insight:Good   Executive Functions  Concentration:Good  Attention Span:Good  Recall:Good  Fund of Knowledge:Good  Language:Good   Psychomotor Activity  Psychomotor Activity:Psychomotor Activity: Normal   Assets  Assets:Communication Skills; Desire for Improvement; Financial Resources/Insurance; Housing; Resilience; Social Support; Talents/Skills; Transportation; Vocational/Educational   Sleep  Sleep:Sleep: Good Number of Hours of Sleep: 6.75    Physical Exam: Physical Exam Vitals and nursing note reviewed.  Constitutional:      Appearance: Normal appearance.  HENT:     Head: Normocephalic and atraumatic.  Pulmonary:     Effort: Pulmonary effort is normal.  Neurological:     General: No focal deficit present.     Mental Status: She is alert and oriented to person, place, and time.   Review of Systems  All other systems reviewed and are negative. Blood pressure 125/82, pulse (!) 120, temperature 98.6 F (37 C), temperature source Oral, resp. rate 18, height 5\' 8"  (1.727 m), weight 95.7 kg, SpO2 99 %. Body mass index is 32.08 kg/m.   COGNITIVE FEATURES THAT CONTRIBUTE TO RISK:  None    SUICIDE RISK:   Minimal: No identifiable suicidal ideation.  Patients presenting with no risk factors but with morbid ruminations; may be classified as minimal risk based on the severity of the depressive symptoms  PLAN OF CARE: Patient is seen and examined.  Patient is a 20 year old female with the above-stated past psychiatric history who was  transferred to our facility after an intentional overdose of ibuprofen and Tylenol.  She will be admitted to the unit.  She will be integrated in the milieu.  She will be encouraged to attend groups.  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  We will need to get collateral information from her mother, and find out about the relationship issues.  On my discussion today she discusses her boyfriend as though the relationship is still intact.  She is declined medication at this time, and stated she want to work on her coping skills.  Her mood and affect today are quite normal.  She will have available hydroxyzine for anxiety as well as trazodone for sleep as needed.  Review of her most recent laboratories include electrolytes from 7/31.  Her creatinine was 0.92 and her liver function enzymes were normal with an AST of 38 and an ALT of 40.  Her ammonia from 7/26 was 37.  CBC from 7/27 was essentially normal.  Differential was normal.  A PT and INR from 7/31 was normal with a PT of 12.5 and an INR of 0.9.  Her acetaminophen level from 7/30 was less than 10.  Respiratory panel for influenza A, B and coronavirus were negative.  Her HIV was negative.  Her pregnancy test from 7/30 was negative.  Urinalysis from 7/30 showed 30 mg per DL of protein but otherwise negative.  Alcohol and alcohol derivatives were all negative from 7/26.  Drug screen was negative.  MRSA screen was negative.  She had a CT scan of  the head from 7/26 that was essentially normal except for opacification of the nasal passages and nasopharynx.  Portable chest x-ray from 7/26 showed a left intrajugular approach central line that was placed.  Her ETT tip was in good position.  OG tube coursed into the stomach and below the inferior margin of the film.  Lungs were clear.  Her vital signs are stable, she is afebrile.  We discussed today the necessity of psychiatric follow-up as  well as therapy follow-up after the hospitalization.  As stated above we will get the collateral information from her mother with regard to any previous suicide attempts or depressive illness prior to the Accutane.  I certify that inpatient services furnished can reasonably be expected to improve the patient's condition.   Antonieta Pert, MD 04/08/2021, 10:52 AM

## 2021-04-08 NOTE — Progress Notes (Signed)
D::  Patient has been in bed most of the day.  Denied SI and HI, contracts for safety.  Denied A/V hallucinations.  Denied pain. A:  Medications administered per MD orders.  Emotional support and encouragement given patient. R:  Safety maintained with 15 minute checks.

## 2021-04-08 NOTE — BHH Counselor (Signed)
Adult Comprehensive Assessment  Patient ID: Elizabeth Brandt, female   DOB: Sep 20, 2000, 20 y.o.   MRN: [id number]  Information Source: Information source: Patient  Current Stressors:  Patient states their primary concerns and needs for treatment are:: Patient states that she overdosed on tylenol after feeling alone and acting impulsively. Patient stated she was on acutane and could be source of heighthened emotion Patient states their goals for this hospitilization and ongoing recovery are:: Patient reports that she would like to find ways to cope with being alone Educational / Learning stressors: No stressors reported Employment / Job issues: No stressors reported Family Relationships: No stressors reported, family is extremely Surveyor, mining / Lack of resources (include bankruptcy): No stressors reported Housing / Lack of housing: No stressors reported Physical health (include injuries & life threatening diseases): Good health Social relationships: Patient has good peers, family supports and friends. patient reports that boyfriend recently moved away to [place] so they are figuring out their relationship Substance abuse: No past or current substance use Bereavement / Loss: No recent loss  Living/Environment/Situation:  Living Arrangements: Parent Living conditions (as described by patient or guardian): supportive and comfortable Who else lives in the home?: Mother How long has patient lived in current situation?: "My whole life" What is atmosphere in current home: Comfortable, Paramedic, Supportive  Family History:  Marital status: Long term relationship Long term relationship, how long?: 2.5 years What types of issues is patient dealing with in the relationship?: working on long distance relationship Are you sexually active?: Yes What is your sexual orientation?: straight Has your sexual activity been affected by drugs, alcohol, medication, or emotional stress?: no Does patient  have children?: No  Childhood History:  By whom was/is the patient raised?: Both parents Additional childhood history information: mother and father separated when she was 56 years old Description of patient's relationship with caregiver when they were a child: "Great" Patient's description of current relationship with people who raised him/her: "Great" How were you disciplined when you got in trouble as a child/adolescent?: "Normally, got things taken away" Does patient have siblings?: Yes Number of Siblings: 5 Description of patient's current relationship with siblings: "great, supportive" Did patient suffer any verbal/emotional/physical/sexual abuse as a child?: No Did patient suffer from severe childhood neglect?: No Has patient ever been sexually abused/assaulted/raped as an adolescent or adult?: No Was the patient ever a victim of a crime or a disaster?: No Witnessed domestic violence?: No Has patient been affected by domestic violence as an adult?: No  Education:  Highest grade of school patient has completed: some college Currently a student?: Yes Name of school: [employer] How long has the patient attended?: year Learning disability?: No  Employment/Work Situation:   Employment Situation: Employed Where is Patient Currently Employed?: [employer] Long has Patient Been Employed?: past year Are You Satisfied With Your Job?: Yes Do You Work More Than One Job?: No Work Stressors: none reported Patient's Job has Been Impacted by Current Illness: No What is the Longest Time Patient has Held a Job?: 1.5 years Where was the Patient Employed at that Time?: [employer] Has Patient ever Been in the [employer]?: No  Financial Resources:   Financial resources: Income from employment Does patient have a representative payee or guardian?: No  Alcohol/Substance Abuse:   What has been your use of drugs/alcohol within the last 12 months?: none If attempted suicide, did drugs/alcohol  play a role in this?: Yes (overdosed on Tylenol) Alcohol/Substance Abuse Treatment Hx: Denies past  history If yes, describe treatment: N/A Has alcohol/substance abuse ever caused legal problems?: No  Social Support System:   Patient's Community Support System: Good Describe Community Support System: Family, friends Type of faith/religion: [employer]ri[employer] Knuckles How does patient's faith help to cope with current illness?: sometimes  Leisure/Recreation:   Do You Have Hobbies?: Yes Leisure and Hobbies: shopping, drive around, listen to music  Strengths/Needs:   What is the patient's perception of their strengths?: Approachable, Friendly, strong minded, Caring Patient states they can use these personal strengths during their treatment to contribute to their recovery: yes Patient states these barriers may affect/interfere with their treatment: none Patient states these barriers may affect their return to the community: none Other important information patient would like considered in planning for their treatment: none  Discharge Plan:   Currently receiving community mental health services: No Patient states concerns and preferences for aftercare planning are: none Patient states they will know when they are safe and ready for discharge when: Patient states she feels ready for discharge Does patient have access to transportation?: Yes Does patient have financial barriers related to discharge medications?: No Patient description of barriers related to discharge medications: n/a Will patient be returning to same living situation after discharge?: Yes  Summary/Recommendations:   Summary and Recommendations (to be completed by the evaluator): Elizabeth Brandt is a 20 year old female who presented to the Emergency Department after overdosing on tylenol. Patient has a nonexistent mental health history. Patient reports that she was taking Acutane and believes that contributed to her heighthened emotions. Patient states  that she was going through some conflict with her boyfriend after he moved to [place] and working out long distance relationship. Patient states that she felt alone and made her take over the counter medicine impulsively. Patient has supportive community, family and peers.  Patient is currently not connected to outpatient mental health services. While here, Elizabeth Brandt can benefit from crisis stabilization, medication management, therapeutic milieu, and referrals for services.  Elizabeth Brandt E Joyce Leckey. 04/08/2021

## 2021-04-08 NOTE — Plan of Care (Signed)
Nurse discussed anxiety, depression and coping skills with patient.  

## 2021-04-09 ENCOUNTER — Other Ambulatory Visit: Payer: Self-pay | Admitting: *Deleted

## 2021-04-09 LAB — TSH: TSH: 2.069 u[IU]/mL (ref 0.350–4.500)

## 2021-04-09 NOTE — Progress Notes (Signed)
Recreation Therapy Notes  Date:  8.1.22 Time: 0930 Location: [address] Dayroom  Group Topic: Stress Management  Goal Area(s) Addresses:  Patient will identify positive stress management techniques. Patient will identify benefits of using stress management post d/c.  Behavioral Response: Appropriate  Intervention: Stress Management  Activity :  Meditation.  LRT played a meditation that focused on challenging negative thoughts.  Patients were to listen and follow along as meditation was played to fully engaged in activity.  Education:  Stress Management, Discharge Planning.   Education Outcome: Acknowledges Education  Clinical Observations/Feedback: Pt attended and participated in group.  Pt had no concerns.    Caroll Rancher, LRT/CTRS         Lillia Abed, Julian Askin A 04/09/2021 11:22 AM

## 2021-04-09 NOTE — [facility] Group Notes (Signed)
Therapy Type: Group Therapy  Participation Level:  Active   Patients received a worksheet with an outline of 2 gingerbread men with a separation in the middle of the page. One sign designated what the pt sees about themselves and the other is what others see. Pts were asked to introduce themselves and share something they like about themself. Pts were then asked to draw, write or color how they view themselves as well as how they are viewed by others. CSW led discussion about the feelings and words associated with each side.   Patient Summary:   During introductions pt shared their name and stated they liked their kindness about themselves. Pt was appropriate and participated in group discussion.

## 2021-04-09 NOTE — Progress Notes (Signed)
   04/08/21 2300  Psych Admission Type (Psych Patients Only)  Admission Status Voluntary  Psychosocial Assessment  Patient Complaints None  Eye Contact Fair  Facial Expression Flat  Affect Depressed  Speech Logical/coherent  Interaction No initiation;Minimal  Motor Activity Slow  Appearance/Hygiene In scrubs  Behavior Characteristics Appropriate to situation  Thought Process  Coherency WDL  Content WDL  Delusions None reported or observed  Perception WDL  Hallucination None reported or observed  Judgment Poor  Confusion None  Danger to Self  Current suicidal ideation? Denies  Danger to Others  Danger to Others None reported or observed

## 2021-04-09 NOTE — Progress Notes (Signed)
[facility] MD Progress Note  04/09/2021 3:07 PM Elizabeth Brandt  MRN:  [id number] Subjective:   Elizabeth Brandt is a 37 YOF with no significant PPHx presenting with an intentional overdose of tylenol for which she required intubation.   24 hr events: no documented behavioral issues, no PRN medications given for agitation.  On interview this morning, patient is pleasant with a linear and logical thought process. Her affect is appropriate. She engages well and appears forthright. She describes her mood as "steady" and reports feeling bored here. She has been keeping herself busy by doing puzzles. She reports a phone call with her mother. And she brings up a question asked by this interviewer on the previous day: why did she not contact her mother with her feelings of isolation and depression? The patient provides the explanation that her mother is very religious and believes that there is one answer to most problems. She is often not accepting and open to dealing with negative emotions. With regard to her boyfriend, she reports having a casual lighthearted conversation the phone. She plans to talk on a deeper level once she is outside the hospital. She denies SI currently and does not want an antidepressant. ROS as below.   Principal Problem: Intentional overdose of drug in tablet form (HCC) Diagnosis: Principal Problem:   Intentional overdose of drug in tablet form (HCC) Active Problems:   MDD (major depressive disorder)  Total Time Spent in Direct Patient Care: I personally spent 30 minutes on the unit in direct patient care. The direct patient care time included face-to-face time with the patient, reviewing the patient's chart, communicating with other professionals, and coordinating care. Greater than 50% of this time was spent in counseling or coordinating care with the patient regarding goals of hospitalization, psycho-education, and discharge planning needs.   Past Psychiatric History: none  Past  Medical History: History reviewed. No pertinent past medical history. History reviewed. No pertinent surgical history. Family History: History reviewed. No pertinent family history. Family Psychiatric  History: unknown Social History:  Social History   Substance and Sexual Activity  Alcohol Use Never     Social History   Substance and Sexual Activity  Drug Use Never    Social History   Socioeconomic History   Marital status: Single    Spouse name: Not on file   Number of children: 0   Years of education: Not on file   Highest education level: 12th grade  Occupational History   Not on file  Tobacco Use   Smoking status: Never   Smokeless tobacco: Never  Vaping Use   Vaping Use: Never used  Substance and Sexual Activity   Alcohol use: Never   Drug use: Never   Sexual activity: Yes    Birth control/protection: Pill  Other Topics Concern   Not on file  Social History Narrative   Taking college classes    Social Determinants of Health   Financial Resource Strain: Not on file  Food Insecurity: Not on file  Transportation Needs: Not on file  Physical Activity: Not on file  Stress: Not on file  Social Connections: Not on file   Additional Social History:     See above  Sleep: Good  Appetite:  Fair  Current Medications: Current Facility-Administered Medications  Medication Dose Route Frequency Provider Last Rate Last Admin   acetaminophen (TYLENOL) tablet 650 mg  650 mg Oral Q6H PRN Antonieta Pert, MD       alum & mag hydroxide-simeth (MAALOX/MYLANTA)  200-200-20 MG/5ML suspension 30 mL  30 mL Oral Q4H PRN Antonieta Pertlary, Greg Lawson, MD       hydrOXYzine (ATARAX/VISTARIL) tablet 25 mg  25 mg Oral TID PRN Antonieta Pertlary, Greg Lawson, MD       magnesium hydroxide (MILK OF MAGNESIA) suspension 30 mL  30 mL Oral Daily PRN Antonieta Pertlary, Greg Lawson, MD       traZODone (DESYREL) tablet 50 mg  50 mg Oral QHS PRN Antonieta Pertlary, Greg Lawson, MD        Lab Results:  Results for orders placed or  performed during the hospital encounter of 04/07/21 (from the past 48 hour(s))  Acetaminophen level     Status: Abnormal   Collection Time: 04/07/21  6:17 PM  Result Value Ref Range   Acetaminophen (Tylenol), Serum <10 (L) 10 - 30 ug/mL    Comment: (NOTE) Therapeutic concentrations vary significantly. A range of 10-30 ug/mL  may be an effective concentration for many patients. However, some  are best treated at concentrations outside of this range. Acetaminophen concentrations >150 ug/mL at 4 hours after ingestion  and >50 ug/mL at 12 hours after ingestion are often associated with  toxic reactions.  Performed at [facility], [address] [place], [place] [address]   Urinalysis, Complete w Microscopic Urine, Unspecified Source     Status: Abnormal   Collection Time: 04/07/21  6:37 PM  Result Value Ref Range   Color, Urine YELLOW YELLOW   APPearance HAZY (A) CLEAR   Specific Gravity, Urine 1.020 1.005 - 1.030   pH 6.0 5.0 - 8.0   Glucose, UA NEGATIVE NEGATIVE mg/dL   Hgb urine dipstick NEGATIVE NEGATIVE   Bilirubin Urine NEGATIVE NEGATIVE   Ketones, ur NEGATIVE NEGATIVE mg/dL   Protein, ur 30 (A) NEGATIVE mg/dL   Nitrite NEGATIVE NEGATIVE   Leukocytes,Ua NEGATIVE NEGATIVE   RBC / HPF 0-5 0 - 5 RBC/hpf   WBC, UA 0-5 0 - 5 WBC/hpf   Bacteria, UA RARE (A) NONE SEEN   Squamous Epithelial / LPF 0-5 0 - 5   Mucus PRESENT     Comment: Performed at [facility], [address] [place], [place] [address]  Pregnancy, urine     Status: None   Collection Time: 04/07/21  6:37 PM  Result Value Ref Range   Preg Test, Ur NEGATIVE NEGATIVE    Comment:        THE SENSITIVITY OF THIS METHODOLOGY IS >20 mIU/mL. Performed at [facility], [address] [place], [place] [address]   Comprehensive metabolic panel     Status: None   Collection Time: 04/08/21  6:15 AM  Result Value Ref Range   Sodium 139 135 - 145 mmol/L    Potassium 3.7 3.5 - 5.1 mmol/L   Chloride 108 98 - 111 mmol/L   CO2 24 22 - 32 mmol/L   Glucose, Bld 95 70 - 99 mg/dL    Comment: Glucose reference range applies only to samples taken after fasting for at least 8 hours.   BUN 15 6 - 20 mg/dL   Creatinine, Ser 9.140.92 0.44 - 1.00 mg/dL   Calcium 9.1 8.9 - 78.210.3 mg/dL   Total Protein 6.8 6.5 - 8.1 g/dL   Albumin 3.5 3.5 - 5.0 g/dL   AST 38 15 - 41 U/L   ALT 40 0 - 44 U/L   Alkaline Phosphatase 40 38 - 126 U/L   Total Bilirubin 0.5 0.3 - 1.2 mg/dL  GFR, Estimated >60 >60 mL/min    Comment: (NOTE) Calculated using the CKD-EPI Creatinine Equation (2021)    Anion gap 7 5 - 15    Comment: Performed at [facility], [address], [place], [place] [address]  Protime-INR     Status: None   Collection Time: 04/08/21  6:15 AM  Result Value Ref Range   Prothrombin Time 12.5 11.4 - 15.2 seconds   INR 0.9 0.8 - 1.2    Comment: (NOTE) INR goal varies based on device and disease states. Performed at [facility], [address] [place], [place] [address]     Blood Alcohol level:  Lab Results  Component Value Date   ETH <10 04/03/2021    Metabolic Disorder Labs: No results found for: HGBA1C, MPG No results found for: PROLACTIN No results found for: CHOL, TRIG, HDL, CHOLHDL, VLDL, LDLCALC  Physical Findings: AIMS: NA CIWA:   NA COWS:   NA  Musculoskeletal: Strength & Muscle Tone:  not assessed Gait & Station: normal Patient leans: N/A  Psychiatric Specialty Exam:  Presentation  General Appearance: Casual  Eye Contact:Good  Speech:Clear and Coherent  Speech Volume:Normal  Handedness:Right   Mood and Affect  Mood:Euthymic  Affect:Congruent   Thought Process  Thought Processes:Coherent; Goal Directed; Linear  Descriptions of Associations:Intact  Orientation:Full (Time, Place and Person)  Thought Content:Logical  History of Schizophrenia/Schizoaffective disorder:No data  recorded Duration of Psychotic Symptoms:No data recorded Hallucinations:Hallucinations: None  Ideas of Reference:None  Suicidal Thoughts:Suicidal Thoughts: No  Homicidal Thoughts:Homicidal Thoughts: No   Sensorium  Memory:Immediate Good; Recent Good; Remote Good  Judgment:Fair  Insight:Fair   Executive Functions  Concentration:Good  Attention Span:Good  Recall:Good  Fund of Knowledge:Good  Language:Good   Psychomotor Activity  Psychomotor Activity:Psychomotor Activity: Normal   Assets  Assets:Housing; Research scientist (medical); Physical Health   Sleep  Sleep:Sleep: Fair Number of Hours of Sleep: 6.75    Physical Exam: Physical Exam Vitals and nursing note reviewed.  HENT:     Head: Normocephalic and atraumatic.  Pulmonary:     Effort: Pulmonary effort is normal.  Neurological:     General: No focal deficit present.     Mental Status: She is alert and oriented to person, place, and time.   Review of Systems  Respiratory:  Negative for shortness of breath.   Cardiovascular:  Negative for chest pain.  Gastrointestinal:  Negative for nausea and vomiting.  Neurological:  Negative for headaches.  Blood pressure 131/83, pulse (!) 116, temperature 98.6 F (37 C), temperature source Oral, resp. rate 16, height 5\' 8"  (1.727 m), weight 95.7 kg, SpO2 99 %. Body mass index is 32.08 kg/m.   Treatment Plan Summary: Daily contact with patient to assess and evaluate symptoms and progress in treatment and Medication management  Safety and Monitoring -- VOLUNTARY admission to inpatient psychiatric unit for safety, stabilization and treatment -- Daily contact with patient to assess and evaluate symptoms and progress in treatment -- Patient's case to be discussed in multi-disciplinary team meeting -- Observation Level : q15 minute checks -- Vital signs:  q12 hours -- Precautions: suicide  Suicide Attempt, severe Patient denies any previous episodes of depressed mood and  reports the attempt was in the context of a 1 week period of isolation and loneliness, secondary to her boyfriend leaving for medical school.  -Continue with supportive psychotherapy -Collateral contact with Mother to assess patient's previous mental health -Patient is currently refusing antidepressant therapy  Medical Management Covid negative CMP: LFTs  WNL (7/31) CBC: (7/27) unremarkable UDS: neg Upreg: neg TSH: ordered UA: unremarkable  Continue PRN's: Tylenol, Maalox, Atarax, Milk of Magnesia, Trazodone  Carlyn Reichert PGY-1, Psychiatry

## 2021-04-09 NOTE — Tx Team (Signed)
Interdisciplinary Treatment and Diagnostic Plan Update  04/09/2021 Time of Session: 9:45am Elizabeth Brandt MRN: [id number]  Principal Diagnosis: Intentional overdose of drug in tablet form [facility])  Secondary Diagnoses: Principal Problem:   Intentional overdose of drug in tablet form ([place]) Active Problems:   MDD (major depressive disorder)   Current Medications:  Current Facility-Administered Medications  Medication Dose Route Frequency Provider Last Rate Last Admin   acetaminophen (TYLENOL) tablet 650 mg  650 mg Oral Q6H PRN Sharma Covert, MD       alum & mag hydroxide-simeth (MAALOX/MYLANTA) 200-200-20 MG/5ML suspension 30 mL  30 mL Oral Q4H PRN Sharma Covert, MD       hydrOXYzine (ATARAX/VISTARIL) tablet 25 mg  25 mg Oral TID PRN Sharma Covert, MD       magnesium hydroxide (MILK OF MAGNESIA) suspension 30 mL  30 mL Oral Daily PRN Sharma Covert, MD       traZODone (DESYREL) tablet 50 mg  50 mg Oral QHS PRN Sharma Covert, MD       PTA Medications: Medications Prior to Admission  Medication Sig Dispense Refill Last Dose   JOLESSA 0.15-0.03 MG tablet Take 1 tablet by mouth daily.       Patient Stressors:    Patient Strengths: Average or above average intelligence Communication skills Physical Health Supportive family/friends Work skills  Treatment Modalities: Medication Management, Group therapy, Case management,  1 to 1 session with clinician, Psychoeducation, Recreational therapy.   Physician Treatment Plan for Primary Diagnosis: Intentional overdose of drug in tablet form ([place]) Long Term Goal(s): Improvement in symptoms so as ready for discharge   Short Term Goals: Ability to identify changes in lifestyle to reduce recurrence of condition will improve Ability to verbalize feelings will improve Ability to disclose and discuss suicidal ideas Ability to demonstrate self-control will improve Ability to identify and develop effective coping behaviors  will improve Ability to maintain clinical measurements within normal limits will improve  Medication Management: Evaluate patient's response, side effects, and tolerance of medication regimen.  Therapeutic Interventions: 1 to 1 sessions, Unit Group sessions and Medication administration.  Evaluation of Outcomes: Not Met  Physician Treatment Plan for Secondary Diagnosis: Principal Problem:   Intentional overdose of drug in tablet form ([place]) Active Problems:   MDD (major depressive disorder)  Long Term Goal(s): Improvement in symptoms so as ready for discharge   Short Term Goals: Ability to identify changes in lifestyle to reduce recurrence of condition will improve Ability to verbalize feelings will improve Ability to disclose and discuss suicidal ideas Ability to demonstrate self-control will improve Ability to identify and develop effective coping behaviors will improve Ability to maintain clinical measurements within normal limits will improve     Medication Management: Evaluate patient's response, side effects, and tolerance of medication regimen.  Therapeutic Interventions: 1 to 1 sessions, Unit Group sessions and Medication administration.  Evaluation of Outcomes: Not Met   RN Treatment Plan for Primary Diagnosis: Intentional overdose of drug in tablet form ([place]) Long Term Goal(s): Knowledge of disease and therapeutic regimen to maintain health will improve  Short Term Goals: Ability to remain free from injury will improve, Ability to verbalize frustration and anger appropriately will improve, Ability to demonstrate self-control, Ability to identify and develop effective coping behaviors will improve, and Compliance with prescribed medications will improve  Medication Management: RN will administer medications as ordered by provider, will assess and evaluate patient's response and provide education to patient for prescribed medication. RN will  report any adverse and/or side  effects to prescribing provider.  Therapeutic Interventions: 1 on 1 counseling sessions, Psychoeducation, Medication administration, Evaluate responses to treatment, Monitor vital signs and CBGs as ordered, Perform/monitor CIWA, COWS, AIMS and Fall Risk screenings as ordered, Perform wound care treatments as ordered.  Evaluation of Outcomes: Not Met   LCSW Treatment Plan for Primary Diagnosis: Intentional overdose of drug in tablet form ([place]) Long Term Goal(s): Safe transition to appropriate next level of care at discharge, Engage patient in therapeutic group addressing interpersonal concerns.  Short Term Goals: Engage patient in aftercare planning with referrals and resources, Increase social support, Increase ability to appropriately verbalize feelings, Identify triggers associated with mental health/substance abuse issues, and Increase skills for wellness and recovery  Therapeutic Interventions: Assess for all discharge needs, 1 to 1 time with Social worker, Explore available resources and support systems, Assess for adequacy in community support network, Educate family and significant other(s) on suicide prevention, Complete Psychosocial Assessment, Interpersonal group therapy.  Evaluation of Outcomes: Not Met   Progress in Treatment: Attending groups: Yes. Participating in groups: Yes. Taking medication as prescribed: Yes. Toleration medication: Yes. Family/Significant other contact made: No, will contact:  mother Patient understands diagnosis: Yes. Discussing patient identified problems/goals with staff: Yes. Medical problems stabilized or resolved: Yes. Denies suicidal/homicidal ideation: Yes. Issues/concerns per patient self-inventory: No.   New problem(s) identified: No, Describe:  none  New Short Term/Long Term Goal(s): medication stabilization, elimination of SI thoughts, development of comprehensive mental wellness plan.    Patient Goals:  "To learn positive coping  strategies"  Discharge Plan or Barriers: Pt has been set up with [facility] in [place] for continued medication management and therapy   Reason for Continuation of Hospitalization: Anxiety Depression Medication stabilization Suicidal ideation  Estimated Length of Stay: 3-5 days  Attendees: Patient: Elizabeth Brandt  04/09/2021   Physician: Fatima Sanger, DO 04/09/2021   Nursing:  04/09/2021  RN Care Manager: 04/09/2021   Social Worker: Darletta Moll, LCSW 04/09/2021   Recreational Therapist:  04/09/2021   Other:  04/09/2021   Other:  04/09/2021   Other: 04/09/2021    Scribe for Treatment Team: Vassie Moselle, [place] 04/09/2021 2:32 PM

## 2021-04-09 NOTE — Patient Outreach (Addendum)
[facility]) Care Management  04/09/2021  Elizabeth Brandt [birth year] [id number]   Case Closure   Referral received:04/04/21 Initial outreach:04/09/21 Insurance: [place] [employer]   Objective: Per the electronic medical record, Elizabeth Brandt  was hospitalized at [facility] 7/26-7/30/22 for Overdose, she was discharged to [facility] 7/30.   Plan: This RNCM will close case to [facility] care management at this time care will be managed at  [facility] health .    Egbert Garibaldi, RN, BSN  [facility] Care Management,Care Management Coordinator  [phone]- Mobile [phone]- Toll Free Main Office

## 2021-04-09 NOTE — Progress Notes (Signed)
D:  Patient denied SI and HI, contracts for safety.  Denied A/V hallucinations.  Denied pain. A:  Medications administered per MD orders.  Emotional support and encouragement given patient. R:  Safety maintained with 15 minute checks.  

## 2021-04-09 NOTE — [facility] Group Notes (Signed)
Patient didi not attend morning orientation group .

## 2021-04-09 NOTE — [facility] Group Notes (Signed)
Spiritual care group on grief and loss facilitated by chaplain Dyanne Carrel, [facility]   Group Goal:   Support / Education around grief and loss   Members engage in facilitated group support and psycho-social education.   Group Description:   Following introductions and group rules, group members engaged in facilitated group dialog and support around topic of loss, with particular support around experiences of loss in their lives. Group Identified types of loss (relationships / self / things) and identified patterns, circumstances, and changes that precipitate losses. Reflected on thoughts / feelings around loss, normalized grief responses, and recognized variety in grief experience. Group noted Worden's four tasks of grief in discussion.   Group drew on Adlerian / Rogerian, narrative, MI,   Patient Progress:  Elizabeth Brandt attended group and engaged in group conversation.  She was able to identify some feelings associated with grief and shared about an experience of hers.  Chaplain Dyanne Carrel, [facility] Pager, [phone] 3:09 PM

## 2021-04-09 NOTE — Progress Notes (Signed)
Patient discussed anxiety, depression and coping skills with patient.  

## 2021-04-10 ENCOUNTER — Telehealth (HOSPITAL_COMMUNITY): Payer: Self-pay | Admitting: Psychiatry

## 2021-04-10 DIAGNOSIS — T50902A Poisoning by unspecified drugs, medicaments and biological substances, intentional self-harm, initial encounter: Secondary | ICD-10-CM

## 2021-04-10 NOTE — Telephone Encounter (Signed)
D:  Mila Merry, LCSW [facility]) referred pt to MH-IOP.  A:  Oriented pt.  Encouraged pt to verify her insurance benefits.  Pt is scheduled to start MH-IOP on 04-23-21.  R:  Pt receptive.

## 2021-04-10 NOTE — Progress Notes (Signed)
Pt kept to herself much of the evening     04/10/21 0000  Psych Admission Type (Psych Patients Only)  Admission Status Voluntary  Psychosocial Assessment  Patient Complaints None  Eye Contact Fair  Facial Expression Flat  Affect Depressed  Speech Logical/coherent  Interaction No initiation;Minimal  Motor Activity Slow  Appearance/Hygiene In scrubs  Behavior Characteristics Cooperative  Mood Anxious  Thought Process  Coherency WDL  Content WDL  Delusions None reported or observed  Perception WDL  Hallucination None reported or observed  Judgment Poor  Confusion None  Danger to Self  Current suicidal ideation? Denies  Danger to Others  Danger to Others None reported or observed

## 2021-04-10 NOTE — Plan of Care (Signed)
Discharge note  Patient verbalizes readiness for discharge. Follow up plan explained, AVS, Transition record and SRA given. Prescriptions and teaching provided. Belongings returned and signed for. Suicide safety plan completed and signed. Patient verbalizes understanding. Patient denies SI/HI and assures this Clinical research associate they will seek assistance should that change. Patient discharged to lobby where family was waiting.  Problem: Education: Goal: Knowledge of [place] [facility] Education information/materials will improve Outcome: Adequate for Discharge Goal: Emotional status will improve Outcome: Adequate for Discharge Goal: Mental status will improve Outcome: Adequate for Discharge Goal: Verbalization of understanding the information provided will improve Outcome: Adequate for Discharge   Problem: Activity: Goal: Interest or engagement in activities will improve Outcome: Adequate for Discharge Goal: Sleeping patterns will improve Outcome: Adequate for Discharge   Problem: Coping: Goal: Ability to verbalize frustrations and anger appropriately will improve Outcome: Adequate for Discharge Goal: Ability to demonstrate self-control will improve Outcome: Adequate for Discharge   Problem: Health Behavior/Discharge Planning: Goal: Identification of resources available to assist in meeting health care needs will improve Outcome: Adequate for Discharge Goal: Compliance with treatment plan for underlying cause of condition will improve Outcome: Adequate for Discharge   Problem: Physical Regulation: Goal: Ability to maintain clinical measurements within normal limits will improve Outcome: Adequate for Discharge   Problem: Safety: Goal: Periods of time without injury will increase Outcome: Adequate for Discharge

## 2021-04-10 NOTE — [facility] Suicide Risk Assessment (Signed)
[facility] INPATIENT:  Family/Significant Other Suicide Prevention Education  Suicide Prevention Education:  Education Completed; Carvel Getting, Mother (name of family member/significant other) has been identified by the patient as the family member/significant other with whom the patient will be residing, and identified as the person(s) who will aid the patient in the event of a mental health crisis (suicidal ideations/suicide attempt).  With written consent from the patient, the family member/significant other has been provided the following suicide prevention education, prior to the and/or following the discharge of the patient.  Mother reports that she feels patient is safe for discharge.  Mother reports that patient boyfriend had to move and patient was attached to boyfriend. Mother reports that patient acted impulsively after a change with there relationship to long distance.  Mother reports that her boyfriend is still supportive and loves her daughter. Mother reports that there are no guns/firearms in the house and reports that she will lock up medications in a lockbox upon discharge of the patient.  Mother informed about aftercare follow up with patient including therapy and med management services.  Mother agrees with the plan of treatment.   The suicide prevention education provided includes the following: Suicide risk factors Suicide prevention and interventions National Suicide Hotline telephone number [facility] assessment telephone number [facility] Emergency Assistance 911 [facility] and/or Residential Mobile Crisis Unit telephone number  Request made of family/significant other to: Remove weapons (e.g., guns, rifles, knives), all items previously/currently identified as safety concern.   Remove drugs/medications (over-the-counter, prescriptions, illicit drugs), all items previously/currently identified as a safety concern.  The family member/significant other  verbalizes understanding of the suicide prevention education information provided.  The family member/significant other agrees to remove the items of safety concern listed above.  Adaisha Campise E Nakaya Mishkin 04/10/2021, 9:37 AM

## 2021-04-10 NOTE — Progress Notes (Signed)
  [facility] Adult Case Management Discharge Plan :  Will you be returning to the same living situation after discharge:  Yes,  going home to live with mother At discharge, do you have transportation home?: Yes,  Patient neighbor, Zella Ball, will be picking patient up between 1:30pm-2pm Do you have the ability to pay for your medications: Yes,  Insurance  Release of information consent forms completed and in the chart;  Patient's signature needed at discharge.  Patient to Follow up at:  Follow-up Information     BEHAVIORAL HEALTH OUTPATIENT THERAPY [place]. Go on 04/24/2021.   Specialty: Behavioral Health Why: You have an appointment for therapy services on 04/24/21 at 1:20 pm. This appointment will be held in person. Contact information: [address] [id number] mc [place] [address] [phone]        [facility] [place] Follow up on 05/11/2021.   Specialty: Behavioral Health Why: You have an appointment on 05/11/21 at 9:00 am for medication management services.  This will be a Virtual appointment held via MyChart. Contact information: [address] [place] [address] [phone]        BEHAVIORAL HEALTH INTENSIVE PSYCH. Schedule an appointment as soon as possible for a visit.   Specialty: Behavioral Health Why: A referral has been made for the intensive outpatient therapy program.  The provider will call you to schedule an appointment. Contact information: [address] [id number] mc [place] [address] [phone]                Next level of care provider has access to [facility] Link:yes  Safety Planning and Suicide Prevention discussed: Yes,  Mother   Has patient been referred to the Quitline?: N/A patient is not a smoker  Patient has been referred for addiction treatment: N/A  Rickie Gange E Trayson Stitely, LCSW 04/10/2021, 9:51 AM

## 2021-04-10 NOTE — Discharge Summary (Signed)
Physician Discharge Summary Note  Patient:  Elizabeth Brandt is an 20 y.o., female MRN:  [id number] DOB:  09-25-2000 Patient phone:  [phone] (home)  Patient address:   [address] [place] [address],  Total Time Spent in Direct Patient Care on Day of Discharge: I personally spent 30 minutes on the unit in direct patient care. The direct patient care time included face-to-face time with the patient, reviewing the patient's chart, communicating with other professionals, and coordinating care. Greater than 50% of this time was spent in counseling or coordinating care with the patient regarding goals of hospitalization, psycho-education, and discharge planning needs.   Date of Admission:  04/07/2021 Date of Discharge: 04/10/2021  Reason for Admission:   Per H and P: Patient is seen and examined.  Patient is a 20 year old female with an essentially negative past psychiatric history who originally presented to the [facility] emergency department on 04/03/2021 after having been found by emergency medical services unresponsive.  The patient's mother reported that the patient had last been seen at 9 AM on that date.  The mother reported that the patient had broken up with a significant other recently, and an empty bottle of Tylenol as well as Duexis and hemp lotion was found.  Upon arrival to the emergency department she was unresponsive and vomiting.  She was intubated to protect her airway.  On evaluation in the emergency department collateral information revealed that her daughter had been saying strange things lately, and had broken up with her boyfriend.  The boyfriend texted the mother asking that she check on the daughter and that was when she was found unresponsive.  There was no suicide note.  She was admitted to the critical care unit.  Her Tylenol level on admission was 110.  She was given N-acetylcysteine.  While she was intubated she had an EEG done that showed continuous slow wave  as well as excessive beta wave problems suggesting severe diffuse encephalopathy.  She was successfully extubated on 7/27.  This was a self extubation.  Psychiatric consultation was done on 04/04/2021.  During that evaluation she was quite regretful, and did not realize that "so many people cared about me".  The decision to transfer to the psychiatric hospital was made and she was transferred on 04/07/2021.  On evaluation this morning the patient denied any previous history of psychiatric illness or psychiatric treatment.  She admitted that she had been treated with Accutane.  She had started this approximately in February.  She stated that her severe depressive symptoms started approximately 1 to 2 weeks prior to her suicide attempt.  Today she reports that her mood is approximately a 7 out of 10.  She denied suicidal ideation.  Prior to February she denied any previous history of helplessness, hopelessness or worthlessness.  She denied any previous sexual or emotional trauma.  She denied any previous psychiatric treatment.  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  She was admitted to our facility for evaluation and stabilization.  Principal Problem: Intentional overdose of drug in tablet form [facility]) Discharge Diagnoses: Principal Problem:   Intentional overdose of drug in tablet form (HCC) Active Problems:   MDD (major depressive disorder)   Past Psychiatric History: Patient denied any previous psychiatric medications, psychiatric evaluations or psychiatric treatment in the past.  Past Medical History: History reviewed. No pertinent past medical history. History reviewed. No pertinent surgical history. Family History: History reviewed. No pertinent family history. Family Psychiatric  History: Patient denied any  family history of psychiatric illness, substance abuse or suicide attempts. Social History:  Social History   Substance and Sexual Activity  Alcohol Use Never     Social History   Substance and  Sexual Activity  Drug Use Never    Social History   Socioeconomic History   Marital status: Single    Spouse name: Not on file   Number of children: 0   Years of education: Not on file   Highest education level: 12th grade  Occupational History   Not on file  Tobacco Use   Smoking status: Never   Smokeless tobacco: Never  Vaping Use   Vaping Use: Never used  Substance and Sexual Activity   Alcohol use: Never   Drug use: Never   Sexual activity: Yes    Birth control/protection: Pill  Other Topics Concern   Not on file  Social History Narrative   Taking college classes    Social Determinants of Health   Financial Resource Strain: Not on file  Food Insecurity: Not on file  Transportation Needs: Not on file  Physical Activity: Not on file  Stress: Not on file  Social Connections: Not on file    Hospital Course:  The patient was pleasant and agreeable throughout her stay. She did not require any PRN medication for agitation. She refused the initiation of antidepressant medication but did agree to pursue IOP therapy upon discharge. On day of discharge her thought process was linear, logical and goal directed, and her affect was appropriate. She consistently denied SI. She was still determined to be at risk for suicide, but it was determined that the patient had achieved the maximal benefit possible from hospitalization. On day of discharge mother was contacted and she agreed to watch patient closely for the first 48 hrs after discharge. She was discharged to her mother's home.   During the course of his hospitalization, the 15-minute checks were adequate to ensure patient's safety. The patient did not display any dangerous, violent or suicidal behavior on the unit.  The patient interacted with patients & staff appropriately, participated appropriately in the group sessions/therapies. The patient's medications were addressed & adjusted to meet his needs. The patient was recommended  for outpatient follow-up care & medication management upon discharge to assure continuity of care & mood stability.  At the time of discharge the patient is not reporting any acute suicidal/homicidal ideations. The patient feels more confident about their self-care & in managing their mental health. The patient currently denies any new issues or concerns. Education and supportive counseling provided throughout their hospital stay & upon discharge.   Today upon their discharge evaluation with the attending psychiatrist, the patient shares they are doing well and that they feel ready for discharge. The patient denies any other specific concerns. The patient is sleeping well. Their appetite is good. The patient denies other physical complaints. The patient denies AH/VH, delusional thoughts or paranoia. The patient does not appear to be responding to any internal stimuli. The patient feels that their medications have been helpful & is in agreement to continue their current treatment regimen as recommended. The patient was able to engage in safety planning including [facility] or contact emergency services if the patient feels unable to maintain their own safety or the safety of others. Pt had no further questions, comments, or concerns. [facility] belongings in no apparent distress. Transportation per [employer].   Physical Findings: AIMS: NA CIWA:  NA COWS:   NA  Musculoskeletal: Strength & Muscle Tone: NA Gait & Station: normal Patient leans: N/A   Psychiatric Specialty Exam:  Presentation  General Appearance: Casual  Eye Contact:Good  Speech:Clear and Coherent  Speech Volume:Normal  Handedness:Right   Mood and Affect  Mood:Euthymic  Affect:Congruent   Thought Process  Thought Processes:Coherent; Goal Directed; Linear  Descriptions of Associations:Intact  Orientation:Full (Time, Place and Person)  Thought  Content:Logical  History of Schizophrenia/Schizoaffective disorder:No data recorded Duration of Psychotic Symptoms:No data recorded Hallucinations:Hallucinations: None  Ideas of Reference:None  Suicidal Thoughts:Suicidal Thoughts: No  Homicidal Thoughts:Homicidal Thoughts: No   Sensorium  Memory:Immediate Good; Recent Good; Remote Good  Judgment:Fair  Insight:Fair   Executive Functions  Concentration:Good  Attention Span:Good  Recall:Good  Fund of Knowledge:Good  Language:Good   Psychomotor Activity  Psychomotor Activity:Psychomotor Activity: Normal   Assets  Assets:Housing; Research scientist (medical); Physical Health   Sleep  Sleep:Sleep: Fair Number of Hours of Sleep: 6.75    Physical Exam: Physical Exam Vitals and nursing note reviewed.  HENT:     Head: Normocephalic and atraumatic.  Pulmonary:     Effort: Pulmonary effort is normal.  Neurological:     General: No focal deficit present.     Mental Status: She is alert and oriented to person, place, and time.   Review of Systems  Respiratory:  Negative for shortness of breath.   Cardiovascular:  Negative for chest pain.  Gastrointestinal:  Negative for nausea and vomiting.  Neurological:  Negative for headaches.  Blood pressure 120/77, pulse 76, temperature 98.7 F (37.1 C), temperature source Oral, resp. rate 16, height 5\' 8"  (1.727 m), weight 95.7 kg, SpO2 100 %. Body mass index is 32.08 kg/m.   Social History   Tobacco Use  Smoking Status Never  Smokeless Tobacco Never   Tobacco Cessation:  A prescription for an FDA-approved tobacco cessation medication was offered at discharge and the patient refused   Blood Alcohol level:  Lab Results  Component Value Date   ETH <10 04/03/2021    Metabolic Disorder Labs:  No results found for: HGBA1C, MPG No results found for: PROLACTIN No results found for: CHOL, TRIG, HDL, CHOLHDL, VLDL, LDLCALC  See Psychiatric Specialty Exam and Suicide Risk  Assessment completed by Attending Physician prior to discharge.  Discharge destination:  Home  Is patient on multiple antipsychotic therapies at discharge:  No   Has Patient had three or more failed trials of antipsychotic monotherapy by history:  No  Recommended Plan for Multiple Antipsychotic Therapies: NA   Allergies as of 04/10/2021   No Known Allergies      Medication List     TAKE these medications      Indication  Jolessa 0.15-0.03 MG tablet Generic drug: levonorgestrel-ethinyl estradiol Take 1 tablet by mouth daily.  Indication: Birth Control Treatment        Follow-up Information     BEHAVIORAL HEALTH OUTPATIENT THERAPY [place]. Go on 04/24/2021.   Specialty: Behavioral Health Why: You have an appointment for therapy services on 04/24/21 at 1:20 pm. This appointment will be held in person. Contact information: [address] m[address] [place] [phone]        BEHAVIORAL HEALTH INTENSIVE PSYCH. Schedule an appointment as soon as possible for a visit.   Specialty: Behavioral Health Why: A referral has been made for the intensive outpatient therapy program.  The provider will call you to schedule an appointment. If uninterested you can let them know.  Contact information: [address] [phone] mc [place] [address] [phone]                Follow-up recommendations:   Activity as tolerated. Diet as recommended by PCP. Keep all scheduled follow-up appointments as recommended.  Patient is instructed to take all prescribed medications as recommended. Report any side effects or adverse reactions to your outpatient psychiatrist. Patient is instructed to abstain from alcohol and illegal drugs while on prescription medications. In the event of worsening symptoms, patient is instructed to call the crisis hotline, 911, or go to the nearest emergency department for evaluation and  treatment.  Prescriptions given at discharge. Patient agreeable to plan. Given opportunity to ask questions. Appears to feel comfortable with discharge denies any current suicidal or homicidal thought.  Patient is also instructed prior to discharge to: Take all medications as prescribed by mental healthcare provider. Report any adverse effects and or reactions from the medicines to outpatient provider promptly. Patient has been instructed & cautioned: To not engage in alcohol and or illegal drug use while on prescription medicines. In the event of worsening symptoms,  patient is instructed to call the crisis hotline, 911 and or go to the nearest ED for appropriate evaluation and treatment of symptoms. To follow-up with primary care provider for other medical issues, concerns and or health care needs  The patient was evaluated each day by a clinical provider to ascertain response to treatment. Improvement was noted by the patient's report of decreasing symptoms, improved sleep and appetite, affect, medication tolerance, behavior, and participation in unit programming.  Patient was asked each day to complete a self inventory noting mood, mental status, pain, new symptoms, anxiety and concerns.  Patient responded well to medication and being in a therapeutic and supportive environment. Positive and appropriate behavior was noted and the patient was motivated for recovery. The patient worked closely with the treatment team and case manager to develop a discharge plan with appropriate goals. Coping skills, problem solving as well as relaxation therapies were also part of the unit programming.  By the day of discharge patient was in much improved condition than upon admission.  Symptoms were reported as significantly decreased or resolved completely. The patient denied SI/HI and voiced no AVH. The patient was motivated to continue taking medication with a goal of continued improvement in mental health.    Patient was discharged home with a plan to follow up as noted below.   Comments:  as above  Signed: Carlyn Reichert PGY-1, Psychiatry

## 2021-04-10 NOTE — [facility] Suicide Risk Assessment (Signed)
[facility] Discharge Suicide Risk Assessment   Principal Problem: Intentional overdose of drug in tablet form [facility]) Discharge Diagnoses: Principal Problem:   Intentional overdose of drug in tablet form (HCC) Active Problems:   MDD (major depressive disorder)  Patient is a 20 year old female transferred from [facility] hospital for stabilization and treatment of worsening of depression along with suicide attempt  Patient reports that she has worked on her coping skills, knows what she did was wrong, does not feel she needs to be on medications but does acknowledge she needs therapy.  Patient states that she is open to attending IOP program to work on her coping skills, to help with her depression.  On a scale of 0-10, with 0 being no symptoms and 10 being the worst, patient reports her depression is a 2 out of 10.  She does report that her mom is having a biopsy today and that was one of her stressors, states that she does not know what the results are as yet.  In regards to her relationship with her ex-boyfriend, she reports that they are talking again, feels that she was the one pushing him away, and plans to stay in touch with him.  Discussed that on initial admission in the ICU, patient had talked about her break-up with the boyfriend being one of her major stressors.  Patient states that she is open to the idea of an intensive outpatient program to work on her coping skills, her depression. Total Time spent with patient: 30 minutes  Musculoskeletal: Strength & Muscle Tone: within normal limits Gait & Station: normal Patient leans: N/A  Psychiatric Specialty Exam  Presentation  General Appearance: Casual  Eye Contact:Good  Speech:Clear and Coherent  Speech Volume:Normal  Handedness:Right   Mood and Affect  Mood:Euthymic  Duration of Depression Symptoms: No data recorded Affect:Congruent   Thought Process  Thought Processes:Coherent; Goal Directed; Linear  Descriptions of  Associations:Intact  Orientation:Full (Time, Place and Person)  Thought Content:Logical  History of Schizophrenia/Schizoaffective disorder:No data recorded Duration of Psychotic Symptoms:No data recorded Hallucinations:Hallucinations: None  Ideas of Reference:None  Suicidal Thoughts:Suicidal Thoughts: No  Homicidal Thoughts:Homicidal Thoughts: No   Sensorium  Memory:Immediate Good; Recent Good; Remote Good  Judgment:Fair  Insight:Fair   Executive Functions  Concentration:Good  Attention Span:Good  Recall:Good  Fund of Knowledge:Good  Language:Good   Psychomotor Activity  Psychomotor Activity:Psychomotor Activity: Normal   Assets  Assets:Housing; Research scientist (medical); Physical Health   Sleep  Sleep:Sleep: Fair Number of Hours of Sleep: 6.75   Physical Exam: Physical Exam Review of Systems  Constitutional: Negative.  Negative for chills, fever and malaise/fatigue.  HENT: Negative.  Negative for congestion, sinus pain and sore throat.   Eyes: Negative.  Negative for blurred vision, double vision, discharge and redness.  Respiratory: Negative.  Negative for cough, shortness of breath and wheezing.   Cardiovascular: Negative.  Negative for palpitations.  Gastrointestinal: Negative.  Negative for heartburn, nausea and vomiting.  Musculoskeletal: Negative.  Negative for myalgias.  Skin: Negative.   Neurological: Negative.  Negative for dizziness, seizures, loss of consciousness, weakness and headaches.  Psychiatric/Behavioral: Negative.  Negative for depression, hallucinations, substance abuse and suicidal ideas. The patient is not nervous/anxious and does not have insomnia.   Blood pressure 120/77, pulse 76, temperature 98.7 F (37.1 C), temperature source Oral, resp. rate 16, height 5\' 8"  (1.727 m), weight 95.7 kg, SpO2 100 %. Body mass index is 32.08 kg/m.  Mental Status Per Nursing Assessment::   On Admission:  NA (Denies current  suicidal  thoughts)  Demographic Factors:  Adolescent or young adult  Loss Factors: Loss of significant relationship  Historical Factors: Impulsivity  Risk Reduction Factors:   Sense of responsibility to family, Living with another person, especially a relative, Positive social support, and Positive therapeutic relationship  Continued Clinical Symptoms:  Depression:   Impulsivity  Cognitive Features That Contribute To Risk:  None    Suicide Risk:  Minimal: No identifiable suicidal ideation.  Patients presenting with no risk factors but with morbid ruminations; may be classified as minimal risk based on the severity of the depressive symptoms   Follow-up Information     BEHAVIORAL HEALTH OUTPATIENT THERAPY [place]. Go on 04/24/2021.   Specialty: Behavioral Health Why: You have an appointment for therapy services on 04/24/21 at 1:20 pm. This appointment will be held in person. Contact information: [address] [id number] mc [place] [address] [phone]        BEHAVIORAL HEALTH INTENSIVE PSYCH. Schedule an appointment as soon as possible for a visit.   Specialty: Behavioral Health Why: A referral has been made for the intensive outpatient therapy program.  The provider will call you to schedule an appointment. If uninterested you can let them know. Contact information: [address] [id number] mc [place] [address] [phone]                Plan Of Care/Follow-up recommendations:  Activity as tolerated Heart healthy diet Keep follow-up appointments, patient referred to IOP program Crisis and safety planning done in length with patient, patient currently lives with mom, and reports that mom is supportive  Nelly Rout, MD 04/10/2021, 10:50 AM

## 2021-04-12 DIAGNOSIS — M25532 Pain in left wrist: Secondary | ICD-10-CM | POA: Diagnosis not present

## 2021-04-12 DIAGNOSIS — M25531 Pain in right wrist: Secondary | ICD-10-CM | POA: Diagnosis not present

## 2021-04-12 DIAGNOSIS — F322 Major depressive disorder, single episode, severe without psychotic features: Secondary | ICD-10-CM | POA: Diagnosis not present

## 2021-04-20 ENCOUNTER — Ambulatory Visit (HOSPITAL_COMMUNITY): Payer: 59

## 2021-04-23 ENCOUNTER — Telehealth (HOSPITAL_COMMUNITY): Payer: Self-pay | Admitting: Psychiatry

## 2021-04-23 NOTE — Telephone Encounter (Signed)
D:  Pt was scheduled to start MH-IOP today, but she sent cm an email declining virtual MH-IOP.  States she has found a therapist instead.  A:  Informed pt that MH-IOP will no longer be virtual after Labor Day and to give cm a call if she changes her mind about MH-IOP.  R:  Pt receptive.

## 2021-04-24 ENCOUNTER — Ambulatory Visit (HOSPITAL_COMMUNITY): Payer: 59 | Admitting: Clinical

## 2021-04-29 ENCOUNTER — Ambulatory Visit
Admission: EM | Admit: 2021-04-29 | Discharge: 2021-04-29 | Disposition: A | Discharge status: Home / Self Care | Payer: 59 | Attending: Urgent Care | Admitting: Urgent Care

## 2021-04-29 DIAGNOSIS — R350 Frequency of micturition: Secondary | ICD-10-CM | POA: Diagnosis not present

## 2021-04-29 DIAGNOSIS — R102 Pelvic and perineal pain unspecified side: Secondary | ICD-10-CM

## 2021-04-29 LAB — POCT URINALYSIS DIP (MANUAL ENTRY)
Bilirubin, UA: NEGATIVE
Blood, UA: NEGATIVE
Glucose, UA: NEGATIVE mg/dL
Ketones, POC UA: NEGATIVE mg/dL
Leukocytes, UA: NEGATIVE
Nitrite, UA: NEGATIVE
Protein Ur, POC: NEGATIVE mg/dL
Spec Grav, UA: >=1.03 — AB (ref 1.010–1.025)
Urobilinogen, UA: 0.2 E.U./dL
pH, UA: 7 (ref 5.0–8.0)

## 2021-04-29 LAB — POCT URINE PREGNANCY: Preg Test, Ur: NEGATIVE

## 2021-04-29 MED ORDER — NAPROXEN 500 MG PO TABS: 500.0000 mg | ORAL_TABLET | Freq: Two times a day (BID) | ORAL | 0 refills | Status: AC

## 2021-04-29 NOTE — ED Triage Notes (Signed)
Pt present urinary frequency with abdominal pain and pressure. Symptom started last week.

## 2021-04-29 NOTE — ED Provider Notes (Signed)
[facility]   MRN: [id number] DOB: Dec 07, 2000  Subjective:   Elizabeth Brandt is a 20 y.o. female presenting for 1 week history of persistent intermittent lower abdominal/pelvic pain and pressure.  Symptoms are mild in nature.  She is also had urinary frequency.  Denies fever, nausea, vomiting, dysuria, vaginal discharge, flank pain, genital rashes.  Patient is sexually active, does not use condoms for protection.  She does have 1 female sex partner.  Has a history of polycystic ovary syndrome.  No current facility-administered medications for this encounter.  Current Outpatient Medications:    JOLESSA 0.15-0.03 MG tablet, Take 1 tablet by mouth daily., Disp: , Rfl:    No Known Allergies  History reviewed. No pertinent past medical history.   History reviewed. No pertinent surgical history.  History reviewed. No pertinent family history.  Social History   Tobacco Use   Smoking status: Never   Smokeless tobacco: Never  Vaping Use   Vaping Use: Never used  Substance Use Topics   Alcohol use: Never   Drug use: Never    ROS   Objective:   Vitals: BP (!) 143/91 (BP Location: Left Arm)   Pulse 68   Temp 98.1 F (36.7 C) (Oral)   Resp 16   LMP 03/19/2021   SpO2 95%   Physical Exam Constitutional:      General: She is not in acute distress.    Appearance: Normal appearance. She is well-developed. She is not ill-appearing, toxic-appearing or diaphoretic.  HENT:     Head: Normocephalic and atraumatic.     Nose: Nose normal.     Mouth/Throat:     Mouth: Mucous membranes are moist.     Pharynx: Oropharynx is clear.  Eyes:     General: No scleral icterus.       Right eye: No discharge.        Left eye: No discharge.     Extraocular Movements: Extraocular movements intact.     Conjunctiva/sclera: Conjunctivae normal.     Pupils: Pupils are equal, round, and reactive to light.  Cardiovascular:     Rate and Rhythm: Normal rate.  Pulmonary:     Effort:  Pulmonary effort is normal.  Abdominal:     General: Bowel sounds are normal. There is no distension.     Palpations: Abdomen is soft. There is no mass.     Tenderness: There is abdominal tenderness (mild over lower abdomen, pelvic area). There is no right CVA tenderness, left CVA tenderness, guarding or rebound.  Skin:    General: Skin is warm and dry.  Neurological:     General: No focal deficit present.     Mental Status: She is alert and oriented to person, place, and time.  Psychiatric:        Mood and Affect: Mood normal.        Behavior: Behavior normal.        Thought Content: Thought content normal.        Judgment: Judgment normal.    Results for orders placed or performed during the hospital encounter of 04/29/21 (from the past 24 hour(s))  POCT urinalysis dipstick     Status: Abnormal   Collection Time: 04/29/21 12:22 PM  Result Value Ref Range   Color, UA yellow yellow   Clarity, UA clear clear   Glucose, UA negative negative mg/dL   Bilirubin, UA negative negative   Ketones, POC UA negative negative mg/dL   Spec Grav, UA >=6.168 (A) 1.010 -  1.025   Blood, UA negative negative   pH, UA 7.0 5.0 - 8.0   Protein Ur, POC negative negative mg/dL   Urobilinogen, UA 0.2 0.2 or 1.0 E.U./dL   Nitrite, UA Negative Negative   Leukocytes, UA Negative Negative  POCT urine pregnancy     Status: None   Collection Time: 04/29/21 12:22 PM  Result Value Ref Range   Preg Test, Ur Negative Negative    Assessment and Plan :   PDMP not reviewed this encounter.  1. Pelvic pain   2. Urinary frequency     Labs pending, will treat as appropriate based off of results.  I do suspect that she is having pelvic pain related to her PCOS. Naproxen for pain and inflammation. Counseled patient on potential for adverse effects with medications prescribed/recommended today, ER and return-to-clinic precautions discussed, patient verbalized understanding.    Wallis Bamberg, [place] 04/29/21  1237

## 2021-04-30 DIAGNOSIS — L700 Acne vulgaris: Secondary | ICD-10-CM | POA: Diagnosis not present

## 2021-04-30 LAB — CERVICOVAGINAL ANCILLARY ONLY
Bacterial Vaginitis (gardnerella): POSITIVE — AB
Candida Glabrata: NEGATIVE
Candida Vaginitis: POSITIVE — AB
Chlamydia: NEGATIVE
Comment: NEGATIVE
Comment: NEGATIVE
Comment: NEGATIVE
Comment: NEGATIVE
Comment: NEGATIVE
Comment: NORMAL
Neisseria Gonorrhea: NEGATIVE
Trichomonas: NEGATIVE

## 2021-05-01 ENCOUNTER — Telehealth (HOSPITAL_COMMUNITY): Payer: Self-pay | Admitting: Emergency Medicine

## 2021-05-01 MED ORDER — METRONIDAZOLE 500 MG PO TABS: 500.0000 mg | ORAL_TABLET | Freq: Two times a day (BID) | ORAL | 0 refills | Status: DC

## 2021-05-01 MED ORDER — FLUCONAZOLE 150 MG PO TABS: 150.0000 mg | ORAL_TABLET | Freq: Once | ORAL | 0 refills | Status: AC

## 2021-05-11 ENCOUNTER — Telehealth (HOSPITAL_COMMUNITY): Payer: 59 | Admitting: Psychiatry

## 2021-07-10 ENCOUNTER — Other Ambulatory Visit: Payer: Self-pay

## 2021-07-10 ENCOUNTER — Ambulatory Visit
Admission: EM | Admit: 2021-07-10 | Discharge: 2021-07-10 | Disposition: A | Discharge status: Home / Self Care | Payer: 59 | Attending: Emergency Medicine | Admitting: Emergency Medicine

## 2021-07-10 DIAGNOSIS — J029 Acute pharyngitis, unspecified: Secondary | ICD-10-CM | POA: Diagnosis not present

## 2021-07-10 DIAGNOSIS — R058 Other specified cough: Secondary | ICD-10-CM

## 2021-07-10 NOTE — ED Provider Notes (Signed)
[facility]    CSN: [id number] Arrival date & time: 07/10/21  1428      History   Chief Complaint Chief Complaint  Patient presents with   Sore Throat    HPI Elizabeth Brandt is a 20 y.o. female.   Pt reports having a sore throat and cough that started about two days ago.  Patient also complains of some mild body aches but denies fever, chills, nausea, vomiting, diarrhea, headache, nasal congestion, runny nose, shortness of breath.  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  The history is provided by the patient.   History reviewed. No pertinent past medical history.  Patient Active Problem List   Diagnosis Date Noted   MDD (major depressive disorder) 04/07/2021   Intentional overdose of drug in tablet form (HCC) 04/07/2021   Overdose 04/03/2021   Encounter for central line placement    Polycystic ovary syndrome 09/04/2020    History reviewed. No pertinent surgical history.  OB History   No obstetric history on file.      Home Medications    Prior to Admission medications   Medication Sig Start Date End Date Taking? Authorizing Provider  JOLESSA 0.15-0.03 MG tablet Take 1 tablet by mouth daily. 03/21/21   [provider]  naproxen (NAPROSYN) 500 MG tablet Take 1 tablet (500 mg total) by mouth 2 (two) times daily with a meal. 04/29/21   Wallis Bamberg, PA-C    Family History History reviewed. No pertinent family history.  Social History Social History   Tobacco Use   Smoking status: Never   Smokeless tobacco: Never  Vaping Use   Vaping Use: Never used  Substance Use Topics   Alcohol use: Never   Drug use: Never     Allergies   Patient has no known allergies.   Review of Systems Review of Systems Pertinent findings noted in history of present illness.    Physical Exam Triage Vital Signs ED Triage Vitals  Enc Vitals Group     BP 07/06/21 0827 (!) 147/82     Pulse Rate 07/06/21 0827 72     Resp 07/06/21  0827 18     Temp 07/06/21 0827 98.3 F (36.8 C)     Temp Source 07/06/21 0827 Oral     SpO2 07/06/21 0827 98 %     Weight --      Height --      Head Circumference --      Peak Flow --      Pain Score 07/06/21 0826 5     Pain Loc --      Pain Edu? --      Excl. in GC? --    No data found.  Updated Vital Signs BP 131/78 (BP Location: Right Arm)   Pulse 87   Temp 98.8 F (37.1 C) (Oral)   Resp 18   LMP 06/16/2021   SpO2 97%   Visual Acuity Right Eye Distance:   Left Eye Distance:   Bilateral Distance:    Right Eye Near:   Left Eye Near:    Bilateral Near:     Physical Exam Vitals and nursing note reviewed.  Constitutional:      General: She is not in acute distress.    Appearance: Normal appearance. She is not ill-appearing.  HENT:     Head: Normocephalic and atraumatic.     Salivary Glands: Right salivary gland is not diffusely enlarged or tender. Left salivary gland  is not diffusely enlarged or tender.     Right Ear: Tympanic membrane, ear canal and external ear normal. No drainage. No middle ear effusion. There is no impacted cerumen. Tympanic membrane is not erythematous or bulging.     Left Ear: Tympanic membrane, ear canal and external ear normal. No drainage.  No middle ear effusion. There is no impacted cerumen. Tympanic membrane is not erythematous or bulging.     Nose: Nose normal. No nasal deformity, septal deviation, mucosal edema, congestion or rhinorrhea.     Right Turbinates: Not enlarged, swollen or pale.     Left Turbinates: Not enlarged, swollen or pale.     Right Sinus: No maxillary sinus tenderness or frontal sinus tenderness.     Left Sinus: No maxillary sinus tenderness or frontal sinus tenderness.     Mouth/Throat:     Lips: Pink. No lesions.     Mouth: Mucous membranes are moist. No oral lesions.     Pharynx: Oropharynx is clear. Uvula midline. No posterior oropharyngeal erythema or uvula swelling.     Tonsils: No tonsillar exudate. 0 on the  right. 0 on the left.  Eyes:     General: Lids are normal.        Right eye: No discharge.        Left eye: No discharge.     Extraocular Movements: Extraocular movements intact.     Conjunctiva/sclera: Conjunctivae normal.     Right eye: Right conjunctiva is not injected.     Left eye: Left conjunctiva is not injected.  Neck:     Trachea: Trachea and phonation normal.  Cardiovascular:     Rate and Rhythm: Normal rate and regular rhythm.     Pulses: Normal pulses.     Heart sounds: Normal heart sounds. No murmur heard.   No friction rub. No gallop.  Pulmonary:     Effort: Pulmonary effort is normal. No accessory muscle usage, prolonged expiration or respiratory distress.     Breath sounds: Normal breath sounds. No stridor, decreased air movement or transmitted upper airway sounds. No decreased breath sounds, wheezing, rhonchi or rales.  Chest:     Chest wall: No tenderness.  Musculoskeletal:        General: Normal range of motion.     Cervical back: Normal range of motion and neck supple. Normal range of motion.  Lymphadenopathy:     Cervical: No cervical adenopathy.  Skin:    General: Skin is warm and dry.     Findings: No erythema or rash.  Neurological:     General: No focal deficit present.     Mental Status: She is alert and oriented to person, place, and time.  Psychiatric:        Mood and Affect: Mood normal.        Behavior: Behavior normal.     UC Treatments / Results  Labs (all labs ordered are listed, but only abnormal results are displayed) Labs Reviewed  COVID-19, FLU A+B AND RSV    EKG   Radiology No results found.  Procedures Procedures (including critical care time)  Medications Ordered in UC Medications - No data to display  Initial Impression / Assessment and Plan / UC Course  I have reviewed the triage vital signs and the nursing notes.  Pertinent labs & imaging results that were available during my care of the patient were reviewed by me  and considered in my medical decision making (see chart for details).  Physical exam today is unremarkable, viral testing performed given patient's occupation.  Conservative care recommended, return precautions provided.  Patient verbalized understanding and agreement of plan as discussed.  All questions were addressed during visit.  Please see discharge instructions below for further details of plan.  Final Clinical Impressions(s) / UC Diagnoses   Final diagnoses:  Acute pharyngitis, unspecified etiology  Non-productive cough     Discharge Instructions      You have a viral upper respiratory illness.  The results of your COVID and influenza, RSV test will be made available to you once they are received, they will be posted to your MyChart account and you will be contacted with any further recommendations if needed.  Conservative care is the mainstay of treatment which includes rest, pushing clear fluids and activity as tolerated.  You may also noticed that your appetite is reduced, this is okay as long as you are drinking plenty of clear fluids.  Acetaminophen: This is a good fever reducer.  If your body temperature rises above 101.5 as measured with a thermometer, it is recommended that you take 1000 mg every 8 hours until your temperature falls below 101.5  Ibuprofen: This is a good anti-inflammatory medication which addresses aches and pains and, to some degree, congestion in the nasal passages.  I recommend taking between 200 to 400 mg every 8 hours as needed.  Pseudoephedrine: This is a decongestant.  This medication has to be purchased from the pharmacist counter, I recommend taking 2 tablets, 60 mg, 2-3 times a day as needed to relieve runny nose and sinus drainage.  Guaifenesin: This is an expectorant.  This helps break up chest congestion and loosen up thick nasal drainage making phlegm and drainage more liquid and therefore easier to remove.  I recommend taking 400 mg 3  times daily as needed.  Dextromethorphan: This is a cough suppressant.  This is often recommended to be taken at nighttime to suppress cough and help people sleep.      ED Prescriptions   None    PDMP not reviewed this encounter.    Theadora Rama Scales, PA-C 07/10/21 1747

## 2021-07-10 NOTE — ED Triage Notes (Signed)
Pt reports having a sore throat and cough that started about two days ago.

## 2021-07-10 NOTE — Discharge Instructions (Addendum)
You have a viral upper respiratory illness.  The results of your COVID and influenza, RSV test will be made available to you once they are received, they will be posted to your MyChart account and you will be contacted with any further recommendations if needed.  Conservative care is the mainstay of treatment which includes rest, pushing clear fluids and activity as tolerated.  You may also noticed that your appetite is reduced, this is okay as long as you are drinking plenty of clear fluids.  Acetaminophen: This is a good fever reducer.  If your body temperature rises above 101.5 as measured with a thermometer, it is recommended that you take 1000 mg every 8 hours until your temperature falls below 101.5  Ibuprofen: This is a good anti-inflammatory medication which addresses aches and pains and, to some degree, congestion in the nasal passages.  I recommend taking between 200 to 400 mg every 8 hours as needed.  Pseudoephedrine: This is a decongestant.  This medication has to be purchased from the pharmacist counter, I recommend taking 2 tablets, 60 mg, 2-3 times a day as needed to relieve runny nose and sinus drainage.  Guaifenesin: This is an expectorant.  This helps break up chest congestion and loosen up thick nasal drainage making phlegm and drainage more liquid and therefore easier to remove.  I recommend taking 400 mg 3 times daily as needed.  Dextromethorphan: This is a cough suppressant.  This is often recommended to be taken at nighttime to suppress cough and help people sleep.

## 2021-07-12 LAB — COVID-19, FLU A+B AND RSV
Influenza A, NAA: NOT DETECTED
Influenza B, NAA: NOT DETECTED
RSV, NAA: NOT DETECTED
SARS-CoV-2, NAA: NOT DETECTED

## 2021-08-09 DIAGNOSIS — H5203 Hypermetropia, bilateral: Secondary | ICD-10-CM | POA: Diagnosis not present

## 2021-09-30 DIAGNOSIS — Z20822 Contact with and (suspected) exposure to covid-19: Secondary | ICD-10-CM | POA: Diagnosis not present

## 2021-10-22 DIAGNOSIS — Z6832 Body mass index (BMI) 32.0-32.9, adult: Secondary | ICD-10-CM | POA: Diagnosis not present

## 2021-10-22 DIAGNOSIS — Z113 Encounter for screening for infections with a predominantly sexual mode of transmission: Secondary | ICD-10-CM | POA: Diagnosis not present

## 2021-10-22 DIAGNOSIS — Z01419 Encounter for gynecological examination (general) (routine) without abnormal findings: Secondary | ICD-10-CM | POA: Diagnosis not present

## 2021-10-23 DIAGNOSIS — A549 Gonococcal infection, unspecified: Secondary | ICD-10-CM | POA: Diagnosis not present

## 2021-10-23 DIAGNOSIS — Z124 Encounter for screening for malignant neoplasm of cervix: Secondary | ICD-10-CM | POA: Diagnosis not present

## 2021-12-28 ENCOUNTER — Encounter: Payer: Self-pay | Admitting: Physician Assistant

## 2021-12-28 ENCOUNTER — Ambulatory Visit (INDEPENDENT_AMBULATORY_CARE_PROVIDER_SITE_OTHER): Payer: 59 | Admitting: Physician Assistant

## 2021-12-28 ENCOUNTER — Other Ambulatory Visit: Payer: Self-pay

## 2021-12-28 VITALS — BP 110/74 | HR 75 | Temp 98.3°F | Ht 68.0 in | Wt 219.0 lb

## 2021-12-28 DIAGNOSIS — F411 Generalized anxiety disorder: Secondary | ICD-10-CM

## 2021-12-28 DIAGNOSIS — Z0000 Encounter for general adult medical examination without abnormal findings: Secondary | ICD-10-CM

## 2021-12-28 DIAGNOSIS — Z23 Encounter for immunization: Secondary | ICD-10-CM

## 2021-12-28 DIAGNOSIS — Z020 Encounter for examination for admission to educational institution: Secondary | ICD-10-CM

## 2021-12-28 DIAGNOSIS — Z111 Encounter for screening for respiratory tuberculosis: Secondary | ICD-10-CM | POA: Diagnosis not present

## 2021-12-28 NOTE — Patient Instructions (Signed)
Good to meet you today!! ? ?Please call EACP program with [facility] and connect with [employer].  ? ?Keep working on lifestyle goals. ? ?Prayers for your family. Call me anytime!  ?

## 2021-12-28 NOTE — Progress Notes (Signed)
? ?Subjective:  ? ? Patient ID: Elizabeth Brandt, female    DOB: [birth year], 21 y.o.   MRN: [id number] ? ?Chief Complaint  ?Patient presents with  ? Establish Care  ? Annual Exam  ?  Pt states she has no concerns. She needs a CPE for school.  ? ? ?HPI ?21 y.o. patient presents today for new patient establishment with me.  Patient was previously established with PCP in [place]. 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 Originally from [place], most of family here now.  ? ?Current Care Team: ?GYN - Dr. Langston Masker   ? ?Acute concerns: ?Mom is going through cancer treatments and not responding well. Pt still processing through this.  ? ?Health maintenance: ?Lifestyle/ exercise: Light exercise, walking more ?Nutrition: Thinks she might eat too much, recently started cooking ?Mental health: Some generalized anxiety, isolated suicide attempt last year after mom's diagnosis; not on medications currently and doing OK ?Caffeine: None ?Sleep: Occasional issues with sleep with shift changes ?Substance use: None ?ETOH: Occasional, social  ?Sexual activity: Active, not-monogamous, no concerns - last screening done in February ?Immunizations: ?HPV vaccines ; needs Tdap updated ?Pap: UTD ?Cycles: Regular  ? ? ? ?Past Medical History:  ?Diagnosis Date  ? Suicide attempt by acetaminophen overdose (HCC) 04/03/2021  ? after mom's cancer diagnosis  ? ? ?History reviewed. No pertinent surgical history. ? ?Family History  ?Problem Relation Age of Onset  ? Cancer Mother   ?     soft-tissue cancer  ? Diabetes Mother   ? Diabetes Father   ? Diabetes Sister   ? ? ?Social History  ? ?Tobacco Use  ? Smoking status: Never  ? Smokeless tobacco: Never  ?Vaping Use  ? Vaping Use: Never used  ?Substance Use Topics  ? Alcohol use: Yes  ?  Alcohol/week: 1.0 - 3.0 standard drink  ?  Types: 1 - 3 Shots of liquor per week  ?  Comment: Not weekly  ? Drug use: Never  ?  ? ?No Known Allergies ? ?Review of Systems ?NEGATIVE UNLESS OTHERWISE INDICATED IN HPI ? ? ?    ?Objective:  ?  ? ?BP 109/74   Pulse 75   Temp 98.3 ?F (36.8 ?C) (Temporal)   Ht 5\' 8"  (1.727 m)   Wt 219 lb (99.3 kg)   LMP 12/14/2021 (Exact Date)   SpO2 98%   BMI 33.30 kg/m?  ? ?Wt Readings from Last 3 Encounters:  ?12/28/21 219 lb (99.3 kg)  ?04/05/21 212 lb 11.9 oz (96.5 kg)  ? ? ?BP Readings from Last 3 Encounters:  ?12/28/21 109/74  ?07/10/21 131/78  ?04/29/21 (!) 143/91  ?  ? ?Physical Exam ?Vitals and nursing note reviewed.  ?Constitutional:   ?   Appearance: Normal appearance. She is normal weight. She is not toxic-appearing.  ?HENT:  ?   Head: Normocephalic and atraumatic.  ?   Right Ear: Tympanic membrane, ear canal and external ear normal.  ?   Left Ear: Tympanic membrane, ear canal and external ear normal.  ?   Nose: Nose normal.  ?   Mouth/Throat:  ?   Mouth: Mucous membranes are moist.  ?Eyes:  ?   Extraocular Movements: Extraocular movements intact.  ?   Conjunctiva/sclera: Conjunctivae normal.  ?   Pupils: Pupils are equal, round, and reactive to light.  ?Cardiovascular:  ?   Rate and Rhythm: Normal rate and regular rhythm.  ?   Pulses: Normal pulses.  ?   Heart sounds: Normal heart sounds.  ?  Pulmonary:  ?   Effort: Pulmonary effort is normal.  ?   Breath sounds: Normal breath sounds.  ?Abdominal:  ?   General: Abdomen is flat. Bowel sounds are normal.  ?   Palpations: Abdomen is soft.  ?Musculoskeletal:     ?   General: Normal range of motion.  ?   Cervical back: Normal range of motion and neck supple.  ?Skin: ?   General: Skin is warm and dry.  ?Neurological:  ?   General: No focal deficit present.  ?   Mental Status: She is alert and oriented to person, place, and time.  ?Psychiatric:     ?   Mood and Affect: Mood normal.     ?   Behavior: Behavior normal.     ?   Thought Content: Thought content normal.     ?   Judgment: Judgment normal.  ? ? ?   ?Assessment & Plan:  ? ?Problem List Items Addressed This Visit   ?None ?Visit Diagnoses   ? ? Encounter for annual physical exam    -   Primary  ? Anxiety, generalized      ? ?  ? ?PLAN: ? ?Pleasant new pt establishment.  ?Age-appropriate screening and counseling performed today. Preventive measures discussed and printed in AVS for patient.  ?Forms completed for [employer] college. ?Up-to-date on immunizations. ?I did recommend regular exercise.  Keep working on healthy eating habits. ?I think she would really benefit from counseling as she has an isolated history last year as well as currently mom going through cancer.  Gave her contact information for the EACP program with [facility]. ? ?Tdap updated today.  ?TB Gold drawn today.  ? ?Regular follow-up with gynecology.  Up-to-date with dental and vision.  Safe sex advised. ? ?Follow-up in 1 year or as needed. ? ? ?This note was prepared with assistance of Conservation officer, historic buildings. Occasional wrong-word or sound-a-like substitutions may have occurred due to the inherent limitations of voice recognition software. ? ? ? ?Antoino Westhoff M Shalev Helminiak, PA-C ?

## 2022-01-01 LAB — QUANTIFERON-TB GOLD PLUS
Mitogen-NIL: >10 IU/mL
NIL: 0.02 IU/mL
QuantiFERON-TB Gold Plus: NEGATIVE
TB1-NIL: 0 IU/mL
TB2-NIL: 0 IU/mL

## 2022-01-02 NOTE — Telephone Encounter (Signed)
All forms have been completed and placed at the front for pick up.  ?

## 2022-01-02 NOTE — Telephone Encounter (Signed)
Pt.notified

## 2022-01-02 NOTE — Telephone Encounter (Signed)
Patient states there is one other form she needs to pick up.  Is requesting a call back at [phone] once ready for pick up

## 2022-01-12 ENCOUNTER — Emergency Department (HOSPITAL_COMMUNITY)
Admission: EM | Admit: 2022-01-12 | Discharge: 2022-01-12 | Disposition: A | Discharge status: Home / Self Care | Payer: 59 | Attending: Emergency Medicine | Admitting: Emergency Medicine

## 2022-01-12 ENCOUNTER — Encounter (HOSPITAL_COMMUNITY): Payer: Self-pay

## 2022-01-12 ENCOUNTER — Other Ambulatory Visit: Payer: Self-pay

## 2022-01-12 ENCOUNTER — Emergency Department (HOSPITAL_COMMUNITY): Payer: 59

## 2022-01-12 DIAGNOSIS — M25572 Pain in left ankle and joints of left foot: Secondary | ICD-10-CM | POA: Diagnosis not present

## 2022-01-12 DIAGNOSIS — W1831XA Fall on same level due to stepping on an object, initial encounter: Secondary | ICD-10-CM | POA: Diagnosis not present

## 2022-01-12 DIAGNOSIS — S93402A Sprain of unspecified ligament of left ankle, initial encounter: Secondary | ICD-10-CM | POA: Diagnosis not present

## 2022-01-12 DIAGNOSIS — S99912A Unspecified injury of left ankle, initial encounter: Secondary | ICD-10-CM | POA: Diagnosis present

## 2022-01-12 DIAGNOSIS — Y92480 Sidewalk as the place of occurrence of the external cause: Secondary | ICD-10-CM | POA: Diagnosis not present
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
## 2022-01-12 NOTE — ED Notes (Signed)
An After Visit Summary was printed and given to the patient. Discharge instructions given and no further questions at this time.  

## 2022-01-12 NOTE — Discharge Instructions (Addendum)
It was a pleasure taking care of you!  ? ?Your x-ray was negative for fracture or dislocation.  You may take over the counter 600 mg Ibuprofen every 6 hours or 500 mg Tylenol every 6 hours as needed for pain for no more than 7 days. You will be given an ankle brace, you may wear it during the day and remove it at night. You will also be given crutches that you may use throughout the day to help with walking. You may apply ice or heat to affected area for up to 15 minutes at a time. Ensure to place a barrier between your skin and the ice/heat.  You may follow-up with your primary care provider as needed.  Attached is information for the on call sports medicine doctor, you may call them if your symptoms persists and worsen past 1 week. Return to the Emergency Department if you are experiencing increasing/worsening pain, swelling, color change, fever, or worsening symptoms. ?

## 2022-01-12 NOTE — Progress Notes (Signed)
Orthopedic Tech Progress Note ?Patient Details:  ?Elizabeth Brandt ?12-Oct-2000 ?[id number] ? ?Ortho Devices ?Type of Ortho Device: ASO, Crutches ?Ortho Device/Splint Location: left ?Ortho Device/Splint Interventions: Application ?  ?Post Interventions ?Patient Tolerated: Well, Ambulated well ?Instructions Provided: Care of device, Adjustment of device, Poper ambulation with device ? ?Saul Fordyce ?01/12/2022, 3:59 PM ? ?
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
## 2022-01-15 DIAGNOSIS — Z20822 Contact with and (suspected) exposure to covid-19: Secondary | ICD-10-CM | POA: Diagnosis not present

## 2022-01-21 DIAGNOSIS — A549 Gonococcal infection, unspecified: Secondary | ICD-10-CM | POA: Diagnosis not present

## 2022-02-07 ENCOUNTER — Encounter: Payer: Self-pay | Admitting: Physician Assistant

## 2022-02-07 ENCOUNTER — Ambulatory Visit (INDEPENDENT_AMBULATORY_CARE_PROVIDER_SITE_OTHER): Payer: 59 | Admitting: Physician Assistant

## 2022-02-07 VITALS — BP 119/78 | HR 79 | Temp 98.5°F | Ht 68.0 in | Wt 217.4 lb

## 2022-02-07 DIAGNOSIS — J029 Acute pharyngitis, unspecified: Secondary | ICD-10-CM | POA: Diagnosis not present

## 2022-02-07 LAB — POCT RAPID STREP A (OFFICE): Rapid Strep A Screen: NEGATIVE

## 2022-02-07 NOTE — Progress Notes (Signed)
Subjective:    Patient ID: Elizabeth Brandt, female    DOB: [birth year], 21 y.o.   MRN: [id number]  Chief Complaint  Patient presents with   Sore Throat    Pt c/o sore throat and runny nose 2-3 days; negative Covid test at home;     Sore Throat   Patient is in today for sore throat.  Chief complaint: Sore throat Symptom onset: 2 to 3 days ago Pertinent positives: Some congestion and rhinorrhea Pertinent negatives: Fever, chills, body aches, shortness of breath or chest pain, abdominal pain, headache or dizziness Treatments tried: Over-the-counter cold and flu medicine Sick exposure: Recent travel, no close contacts that have been sick that she knows of.  Patient has taken a COVID-19 test at home and this was negative.    Family History  Problem Relation Age of Onset   Cancer Mother        soft-tissue cancer   Diabetes Mother    Diabetes Father    Diabetes Sister     Social History   Tobacco Use   Smoking status: Never   Smokeless tobacco: Never  Vaping Use   Vaping Use: Never used  Substance Use Topics   Alcohol use: Yes    Alcohol/week: 1.0 - 3.0 standard drink    Types: 1 - 3 Shots of liquor per week    Comment: Not weekly   Drug use: Never     No Known Allergies  Review of Systems NEGATIVE UNLESS OTHERWISE INDICATED IN HPI      Objective:     BP 119/78 (BP Location: Left Arm)   Pulse 79   Temp 98.5 F (36.9 C) (Temporal)   Ht 5\' 8"  (1.727 m)   Wt 217 lb 6.4 oz (98.6 kg)   SpO2 97%   BMI 33.06 kg/m   Wt Readings from Last 3 Encounters:  02/07/22 217 lb 6.4 oz (98.6 kg)  12/28/21 219 lb (99.3 kg)  04/05/21 212 lb 11.9 oz (96.5 kg)    BP Readings from Last 3 Encounters:  02/07/22 119/78  01/12/22 122/70  12/28/21 110/74     Physical Exam Vitals and nursing note reviewed.  Constitutional:      General: She is not in acute distress.    Appearance: Normal appearance. She is not ill-appearing.  HENT:     Head: Normocephalic.      Right Ear: Tympanic membrane, ear canal and external ear normal.     Left Ear: Tympanic membrane, ear canal and external ear normal.     Nose: No congestion or rhinorrhea.     Mouth/Throat:     Mouth: Mucous membranes are moist. No oral lesions.     Tongue: No lesions.     Palate: No mass and lesions.     Pharynx: Oropharynx is clear. Uvula midline. No pharyngeal swelling, oropharyngeal exudate or posterior oropharyngeal erythema.     Tonsils: No tonsillar exudate or tonsillar abscesses.  Eyes:     Extraocular Movements: Extraocular movements intact.     Conjunctiva/sclera: Conjunctivae normal.     Pupils: Pupils are equal, round, and reactive to light.  Cardiovascular:     Rate and Rhythm: Normal rate and regular rhythm.     Pulses: Normal pulses.     Heart sounds: Normal heart sounds. No murmur heard. Pulmonary:     Effort: Pulmonary effort is normal. No respiratory distress.     Breath sounds: Normal breath sounds. No wheezing.  Musculoskeletal:     Cervical back:  Normal range of motion.  Skin:    General: Skin is warm.  Neurological:     Mental Status: She is alert and oriented to person, place, and time.  Psychiatric:        Mood and Affect: Mood normal.        Behavior: Behavior normal.       Assessment & Plan:   Problem List Items Addressed This Visit   None Visit Diagnoses     Acute pharyngitis, unspecified etiology    -  Primary   Relevant Orders   POCT rapid strep A (Completed)      1. Acute pharyngitis, unspecified etiology Reassured patient that her point-of-care rapid strep a test was negative today.  I did not feel it necessary to check for COVID-19, as she is already taken a home test and that was negative.  Recommended supportive care otherwise including the use of oral antihistamine, decongestant, pushing fluids, throat lozenges, Tylenol or ibuprofen if needed. Counseled patient on ER and return-to-clinic precautions discussed, patient verbalized  understanding.   This note was prepared with assistance of Conservation officer, historic buildings. Occasional wrong-word or sound-a-like substitutions may have occurred due to the inherent limitations of voice recognition software.   Jasmeet Manton M Janika Jedlicka, PA-C

## 2022-02-08 ENCOUNTER — Ambulatory Visit: Payer: 59 | Admitting: Physician Assistant

## 2022-06-03 ENCOUNTER — Encounter: Payer: Self-pay | Admitting: *Deleted

## 2022-06-05 ENCOUNTER — Encounter: Payer: Self-pay | Admitting: Physician Assistant

## 2022-06-05 ENCOUNTER — Ambulatory Visit (INDEPENDENT_AMBULATORY_CARE_PROVIDER_SITE_OTHER): Payer: 59 | Admitting: Physician Assistant

## 2022-06-05 VITALS — BP 110/70 | HR 92 | Temp 97.7°F | Ht 68.0 in | Wt 222.4 lb

## 2022-06-05 DIAGNOSIS — H6123 Impacted cerumen, bilateral: Secondary | ICD-10-CM | POA: Diagnosis not present

## 2022-06-05 DIAGNOSIS — R059 Cough, unspecified: Secondary | ICD-10-CM

## 2022-06-05 LAB — POC INFLUENZA A&B (BINAX/QUICKVUE)
Influenza A, POC: NEGATIVE
Influenza B, POC: NEGATIVE

## 2022-06-05 NOTE — Patient Instructions (Signed)
It was great to see you!  You have a viral upper respiratory infection. Antibiotics are not needed for this.  Viral infections usually take 7-10 days to resolve.  The cough can last a few weeks to go away.  Use medication -- delsym 12 hour cough syrup and flonase (both OTC and both generic)  Push fluids and get plenty of rest. Please return if you are not improving as expected, or if you have high fevers (>101.5) or difficulty swallowing or worsening productive cough.  Call clinic with questions.  I hope you start feeling better soon!

## 2022-06-05 NOTE — Progress Notes (Signed)
Elizabeth Brandt is a 21 y.o. female here for a follow up of a pre-existing problem.  History of Present Illness:   Chief Complaint  Patient presents with   Cough    Cough started today, headache and feeling weak. Home COVID test yesterday was Neg. Denies fever or chills.   Nasal Congestion    Pt c/o nasal congestion x 2 days, clear nasal drainage.     URI  This is a new problem. The current episode started in the past 7 days. The problem has been gradually worsening. There has been no fever. Associated symptoms include congestion, coughing, headaches, a plugged ear sensation, rhinorrhea, a sore throat and swollen glands. She has tried acetaminophen for the symptoms. The treatment provided mild relief.   Cerumen impaction Patient has hx of needing ears cleaned. She would like to pursue this today. Her ears feel full and hearing is reduced.  Past Medical History:  Diagnosis Date   Suicide attempt by acetaminophen overdose (HCC) 04/03/2021   after mom's cancer diagnosis     Social History   Tobacco Use   Smoking status: Never   Smokeless tobacco: Never  Vaping Use   Vaping Use: Never used  Substance Use Topics   Alcohol use: Yes    Alcohol/week: 1.0 - 3.0 standard drink of alcohol    Types: 1 - 3 Shots of liquor per week    Comment: Not weekly   Drug use: Never    History reviewed. No pertinent surgical history.  Family History  Problem Relation Age of Onset   Cancer Mother        soft-tissue cancer   Diabetes Mother    Diabetes Father    Diabetes Sister     No Known Allergies  Current Medications:   Current Outpatient Medications:    benzoyl peroxide (PANOXYL CREAMY WASH) 4 % external liquid, 1 application, Disp: , Rfl:    naproxen (NAPROSYN) 500 MG tablet, Take 1 tablet (500 mg total) by mouth 2 (two) times daily with a meal., Disp: 30 tablet, Rfl: 0   Review of Systems:   Review of Systems  HENT:  Positive for congestion, rhinorrhea and sore throat.    Respiratory:  Positive for cough.   Neurological:  Positive for headaches.    Vitals:   Vitals:   06/05/22 1527  BP: 110/70  Pulse: 92  Temp: 97.7 F (36.5 C)  TempSrc: Temporal  SpO2: 97%  Weight: 222 lb 6.1 oz (100.9 kg)  Height: 5\' 8"  (1.727 m)     Body mass index is 33.81 kg/m.  Physical Exam:   Physical Exam Vitals and nursing note reviewed.  Constitutional:      General: She is not in acute distress.    Appearance: She is well-developed. She is not ill-appearing or toxic-appearing.  HENT:     Head: Normocephalic and atraumatic.     Right Ear: Tympanic membrane, ear canal and external ear normal. There is impacted cerumen.     Left Ear: Tympanic membrane, ear canal and external ear normal. There is impacted cerumen.     Nose: Nose normal.     Right Sinus: No maxillary sinus tenderness or frontal sinus tenderness.     Left Sinus: No maxillary sinus tenderness or frontal sinus tenderness.     Mouth/Throat:     Pharynx: Uvula midline. No posterior oropharyngeal erythema.  Eyes:     General: Lids are normal.     Conjunctiva/sclera: Conjunctivae normal.  Neck:  Trachea: Trachea normal.  Cardiovascular:     Rate and Rhythm: Normal rate and regular rhythm.     Pulses: Normal pulses.     Heart sounds: Normal heart sounds, S1 normal and S2 normal.  Pulmonary:     Effort: Pulmonary effort is normal.     Breath sounds: Normal breath sounds. No decreased breath sounds, wheezing, rhonchi or rales.  Lymphadenopathy:     Cervical: No cervical adenopathy.  Skin:    General: Skin is warm and dry.  Neurological:     Mental Status: She is alert.     GCS: GCS eye subscore is 4. GCS verbal subscore is 5. GCS motor subscore is 6.  Psychiatric:        Speech: Speech normal.        Behavior: Behavior normal. Behavior is cooperative.    Results for orders placed or performed in visit on 06/05/22  POC Influenza A&B(BINAX/QUICKVUE)  Result Value Ref Range   Influenza A,  POC Negative Negative   Influenza B, POC Negative Negative   Ceruminosis is noted.  Wax is removed by syringing and manual debridement.   Assessment and Plan:   Cough, unspecified type No red flags on exam Suspect viral uri Recommend flonase and dextromethorphan cough suppressant Push fluids and rest Follow-up if worsening symptoms  Bilateral impacted cerumen Lavage performed No evidence of significant TM infection  Inda Coke, PA-C

## 2022-08-16 ENCOUNTER — Ambulatory Visit: Payer: 59 | Admitting: Physician Assistant

## 2022-08-16 ENCOUNTER — Other Ambulatory Visit (HOSPITAL_COMMUNITY)
Admission: RE | Admit: 2022-08-16 | Discharge: 2022-08-16 | Disposition: A | Discharge status: Home / Self Care | Payer: 59 | Source: Ambulatory Visit | Attending: Physician Assistant | Admitting: Physician Assistant

## 2022-08-16 ENCOUNTER — Other Ambulatory Visit (INDEPENDENT_AMBULATORY_CARE_PROVIDER_SITE_OTHER): Payer: 59

## 2022-08-16 ENCOUNTER — Encounter: Payer: Self-pay | Admitting: Physician Assistant

## 2022-08-16 VITALS — BP 114/75 | HR 79 | Temp 97.1°F | Ht 68.0 in | Wt 226.2 lb

## 2022-08-16 DIAGNOSIS — R632 Polyphagia: Secondary | ICD-10-CM | POA: Insufficient documentation

## 2022-08-16 DIAGNOSIS — L700 Acne vulgaris: Secondary | ICD-10-CM | POA: Insufficient documentation

## 2022-08-16 DIAGNOSIS — N926 Irregular menstruation, unspecified: Secondary | ICD-10-CM | POA: Insufficient documentation

## 2022-08-16 LAB — CBC WITH DIFFERENTIAL/PLATELET
Basophils Absolute: 0.1 10*3/uL (ref 0.0–0.1)
Basophils Relative: 1.5 % (ref 0.0–3.0)
Eosinophils Absolute: 0 10*3/uL (ref 0.0–0.7)
Eosinophils Relative: 0.5 % (ref 0.0–5.0)
HCT: 31.6 % — ABNORMAL LOW (ref 36.0–46.0)
Hemoglobin: 10.1 g/dL — ABNORMAL LOW (ref 12.0–15.0)
Lymphocytes Relative: 33.3 % (ref 12.0–46.0)
Lymphs Abs: 2.1 10*3/uL (ref 0.7–4.0)
MCHC: 31.9 g/dL (ref 30.0–36.0)
MCV: 71.2 fl — ABNORMAL LOW (ref 78.0–100.0)
Monocytes Absolute: 0.5 10*3/uL (ref 0.1–1.0)
Monocytes Relative: 7.2 % (ref 3.0–12.0)
Neutro Abs: 3.6 10*3/uL (ref 1.4–7.7)
Neutrophils Relative %: 57.5 % (ref 43.0–77.0)
Platelets: 326 10*3/uL (ref 150.0–400.0)
RBC: 4.44 Mil/uL (ref 3.87–5.11)
RDW: 17.5 % — ABNORMAL HIGH (ref 11.5–15.5)
WBC: 6.2 10*3/uL (ref 4.0–10.5)

## 2022-08-16 LAB — POCT URINALYSIS DIPSTICK
Bilirubin, UA: NEGATIVE
Blood, UA: NEGATIVE
Glucose, UA: NEGATIVE
Ketones, UA: NEGATIVE
Leukocytes, UA: NEGATIVE
Nitrite, UA: NEGATIVE
Protein, UA: NEGATIVE
Spec Grav, UA: 1.01 (ref 1.010–1.025)
Urobilinogen, UA: 0.2 E.U./dL
pH, UA: 6 (ref 5.0–8.0)

## 2022-08-16 LAB — COMPREHENSIVE METABOLIC PANEL
ALT: 14 U/L (ref 0–35)
AST: 13 U/L (ref 0–37)
Albumin: 4.4 g/dL (ref 3.5–5.2)
Alkaline Phosphatase: 46 U/L (ref 39–117)
BUN: 16 mg/dL (ref 6–23)
CO2: 29 mEq/L (ref 19–32)
Calcium: 9.2 mg/dL (ref 8.4–10.5)
Chloride: 105 mEq/L (ref 96–112)
Creatinine, Ser: 0.96 mg/dL (ref 0.40–1.20)
GFR: 84.27 mL/min (ref >60.00)
Glucose, Bld: 95 mg/dL (ref 70–99)
Potassium: 3.9 mEq/L (ref 3.5–5.1)
Sodium: 138 mEq/L (ref 135–145)
Total Bilirubin: 0.3 mg/dL (ref 0.2–1.2)
Total Protein: 7.3 g/dL (ref 6.0–8.3)

## 2022-08-16 LAB — TSH: TSH: 0.66 u[IU]/mL (ref 0.35–5.50)

## 2022-08-16 LAB — HEMOGLOBIN A1C: Hgb A1c MFr Bld: 5.8 % (ref 4.6–6.5)

## 2022-08-16 LAB — POCT URINE PREGNANCY: Preg Test, Ur: NEGATIVE

## 2022-08-16 NOTE — Patient Instructions (Signed)
It was great to see you!  I'm going to order pelvic ultrasound to be done next week.  You can either schedule blood work here for another day or go to another [facility] office for labs now. An order for labs has been put in for you. To get your labs today, you can walk in at the [facility] location without a scheduled appointment.  The address is [address]. It is across the street from [facility]. Lab is located in the basement.  Hours of operation are M-F 8:30am to 5:00pm. Please note that they are closed for lunch between 12:30 and 1:00pm.  Take care,  Jarold Motto PA-C

## 2022-08-16 NOTE — Progress Notes (Signed)
Elizabeth Brandt is a 21 y.o. female here for a new problem.  History of Present Illness:   No chief complaint on file.   HPI Menstrual Problem Patient is complaining of a continuous menstrual cycle for 2 months. She states that this has never happen before. Her menstrual flow is bright red and has associated cramps. She expresses that prior to this incident her last period was October 23rd.  She confirms that she is under a lot of stress, sexual activity, and acne. She has a hx of PCOS and facial hair. Patient states that its nothing more than a couple of hairs. Patient has been on oral birth control before, but stopped because she didn't like the way it made her feel. She explains that she was on birth control again with accutane, but stopped once she stopped ance medication. Patient states that her period were regular at some point. Patient reports that she experiences constant pelvic pain prior to this problem. The pain is usually on right side and similar to cramping or is a sharp pain. She confirms that she frequently urinates an hour after eating.  She denies pain with sex, vaginal discharge, and std concerns.  Excessive eating Patient complains that she is constantly hungry and wants to eat. She reports that she ate at [employer] an hour before todays visit and states that she is hungry now. She reports that she used to eat while stressed, but this is different. Fmhx of diabetes.  She denies excessive thirst and blood in stool.   Past Medical History:  Diagnosis Date   Suicide attempt by acetaminophen overdose (HCC) 04/03/2021   after mom's cancer diagnosis     Social History   Tobacco Use   Smoking status: Never   Smokeless tobacco: Never  Vaping Use   Vaping Use: Never used  Substance Use Topics   Alcohol use: Yes    Alcohol/week: 1.0 - 3.0 standard drink of alcohol    Types: 1 - 3 Shots of liquor per week    Comment: Not weekly   Drug use: Never    No past surgical  history on file.  Family History  Problem Relation Age of Onset   Cancer Mother        soft-tissue cancer   Diabetes Mother    Diabetes Father    Diabetes Sister     No Known Allergies  Current Medications:   Current Outpatient Medications:    benzoyl peroxide (PANOXYL CREAMY WASH) 4 % external liquid, 1 application, Disp: , Rfl:    naproxen (NAPROSYN) 500 MG tablet, Take 1 tablet (500 mg total) by mouth 2 (two) times daily with a meal., Disp: 30 tablet, Rfl: 0   Review of Systems:   ROS  Vitals:   There were no vitals filed for this visit.   There is no height or weight on file to calculate BMI.  Physical Exam:   Physical Exam Constitutional:      General: She is not in acute distress.    Appearance: Normal appearance. She is not ill-appearing.  HENT:     Head: Normocephalic and atraumatic.     Right Ear: External ear normal.     Left Ear: External ear normal.  Eyes:     Extraocular Movements: Extraocular movements intact.     Pupils: Pupils are equal, round, and reactive to light.  Cardiovascular:     Rate and Rhythm: Normal rate and regular rhythm.     Heart sounds: Normal heart sounds.  No murmur heard.    No gallop.  Pulmonary:     Effort: Pulmonary effort is normal. No respiratory distress.     Breath sounds: Normal breath sounds. No wheezing or rales.  Skin:    General: Skin is warm and dry.  Neurological:     Mental Status: She is alert and oriented to person, place, and time.  Psychiatric:        Judgment: Judgment normal.     Assessment and Plan:   There are no diagnoses linked to this encounter.   I,Verona Buck,acting as a Neurosurgeon for [employer], PA.,have documented all relevant documentation on the behalf of Jarold Motto, PA,as directed by  Jarold Motto, PA while in the presence of Jarold Motto, [place].   Jarold Motto, PA-C

## 2022-08-17 ENCOUNTER — Emergency Department (HOSPITAL_BASED_OUTPATIENT_CLINIC_OR_DEPARTMENT_OTHER)
Admission: EM | Admit: 2022-08-17 | Discharge: 2022-08-17 | Disposition: A | Discharge status: Home / Self Care | Payer: 59 | Attending: Emergency Medicine | Admitting: Emergency Medicine

## 2022-08-17 ENCOUNTER — Encounter (HOSPITAL_BASED_OUTPATIENT_CLINIC_OR_DEPARTMENT_OTHER): Payer: Self-pay | Admitting: Emergency Medicine

## 2022-08-17 ENCOUNTER — Other Ambulatory Visit: Payer: Self-pay

## 2022-08-17 DIAGNOSIS — W448XXA Other foreign body entering into or through a natural orifice, initial encounter: Secondary | ICD-10-CM

## 2022-08-17 DIAGNOSIS — T192XXA Foreign body in vulva and vagina, initial encounter: Secondary | ICD-10-CM | POA: Diagnosis not present

## 2022-08-17 DIAGNOSIS — X58XXXA Exposure to other specified factors, initial encounter: Secondary | ICD-10-CM | POA: Insufficient documentation

## 2022-08-17 NOTE — Discharge Instructions (Signed)
Follow up outpatient as needed

## 2022-08-17 NOTE — ED Provider Notes (Signed)
[facility] EMERGENCY DEPT Provider Note   CSN: [id number] Arrival date & time: 08/17/22  1916    History  Chief Complaint  Patient presents with   Foreign Body in Vagina    Elizabeth Brandt is a 21 y.o. female no significant past medical history here for evaluation of concern for retained tampon in vagina.  When she pulled out the tampon earlier that it was dry however she does not remember taking of the tampon previously to this.  No pain, discharge.  No dysuria or hematuria. No concern for STD  HPI     Home Medications Prior to Admission medications   Medication Sig Start Date End Date Taking? Authorizing Provider  benzoyl peroxide (PANOXYL CREAMY WASH) 4 % external liquid 1 application 12/28/19   [provider]  naproxen (NAPROSYN) 500 MG tablet Take 1 tablet (500 mg total) by mouth 2 (two) times daily with a meal. Patient not taking: Reported on 08/16/2022 04/29/21   Wallis Bamberg, PA-C      Allergies    Patient has no known allergies.    Review of Systems   Review of Systems  Constitutional: Negative.   HENT: Negative.    Respiratory: Negative.    Cardiovascular: Negative.   Gastrointestinal: Negative.   Genitourinary: Negative.   Musculoskeletal: Negative.   Skin: Negative.   Neurological: Negative.   All other systems reviewed and are negative.   Physical Exam Updated Vital Signs BP 131/79 (BP Location: Left Arm)   Pulse 86   Temp 98.6 F (37 C) (Oral)   Resp 17   LMP 08/03/2022   SpO2 100%  Physical Exam Vitals and nursing note reviewed. Exam conducted with a chaperone present.  Constitutional:      General: She is not in acute distress.    Appearance: She is well-developed. She is not ill-appearing, toxic-appearing or diaphoretic.  HENT:     Head: Atraumatic.  Eyes:     Pupils: Pupils are equal, round, and reactive to light.  Cardiovascular:     Rate and Rhythm: Normal rate.  Pulmonary:     Effort: No respiratory distress.   Abdominal:     General: There is no distension.  Genitourinary:    Comments: Normal appearing external female genitalia without rashes or lesions, normal vaginal epithelium. White tampon against cervical os. Normal appearing cervix without discharge or petechiae. Cervical os is closed. There is no bleeding noted at the os.no Odor. Bimanual: No CMT, non-tender.  No palpable adnexal masses or tenderness. Uterus midline and not fixed. Rectovaginal exam was deferred.  No cystocele or rectocele noted. Exam performed with chaperone in room.   Musculoskeletal:        General: Normal range of motion.     Cervical back: Normal range of motion.  Skin:    General: Skin is warm and dry.  Neurological:     General: No focal deficit present.     Mental Status: She is alert.  Psychiatric:        Mood and Affect: Mood normal.    ED Results / Procedures / Treatments   Labs (all labs ordered are listed, but only abnormal results are displayed) Labs Reviewed - No data to display  EKG None  Radiology No results found.  Procedures .Foreign Body Removal  Date/Time: 08/17/2022 9:44 PM  Performed by: Linwood Dibbles, PA-C Authorized by: Linwood Dibbles, PA-C  Consent: Verbal consent obtained. Written consent not obtained. Risks and benefits: risks, benefits and alternatives were discussed  Consent given by: patient Patient understanding: patient states understanding of the procedure being performed Patient consent: the patient's understanding of the procedure matches consent given Procedure consent: procedure consent matches procedure scheduled Relevant documents: relevant documents present and verified Test results: test results available and properly labeled Site marked: the operative site was marked Imaging studies: imaging studies available Required items: required blood products, implants, devices, and special equipment available Patient identity confirmed: verbally with patient Time  out: Immediately prior to procedure a "time out" was called to verify the correct patient, procedure, equipment, support staff and site/side marked as required. Body area: vagina  Sedation: Patient sedated: no  Patient restrained: no Patient cooperative: no Localization method: visualized Removal mechanism: forceps Complexity: complex 1 objects recovered. Objects recovered: tampon Post-procedure assessment: foreign body removed Patient tolerance: patient tolerated the procedure well with no immediate complications      Medications Ordered in ED Medications - No data to display  ED Course/ Medical Decision Making/ A&P     21 year old here for evaluation of possible retained foreign object.  Noted earlier when she pulled out another tampon she did not report the previous 1.  Denies any abdominal pain, vaginal discharge, rashes or lesions.  GU exam shows retained foreign object- tampon against cervical os.  See procedure note.  Removed without difficulty.  She has no evidence of toxic shock syndrome.  She declines need for STD screening.  She will follow-up outpatient.  The patient has been appropriately medically screened and/or stabilized in the ED. I have low suspicion for any other emergent medical condition which would require further screening, evaluation or treatment in the ED or require inpatient management.  Patient is hemodynamically stable and in no acute distress.  Patient able to ambulate in department prior to ED.  Evaluation does not show acute pathology that would require ongoing or additional emergent interventions while in the emergency department or further inpatient treatment.  I have discussed the diagnosis with the patient and answered all questions.  Pain is been managed while in the emergency department and patient has no further complaints prior to discharge.  Patient is comfortable with plan discussed in room and is stable for discharge at this time.  I have  discussed strict return precautions for returning to the emergency department.  Patient was encouraged to follow-up with PCP/specialist refer to at discharge.                            Medical Decision Making Amount and/or Complexity of Data Reviewed External Data Reviewed: notes.  Risk OTC drugs. Prescription drug management. Diagnosis or treatment significantly limited by social determinants of health.         Final Clinical Impression(s) / ED Diagnoses Final diagnoses:  Retained tampon, initial encounter    Rx / DC Orders ED Discharge Orders     None         Tranquilino Fischler A, PA-C 08/17/22 2148    Ernie Avena, MD 08/18/22 0013

## 2022-08-17 NOTE — ED Triage Notes (Signed)
Pt presents to ED POV. Pt reports she thinks she has a tampon stuck in her. From earlier today. Pt reports that she pulled one out and it was dry which was unusual so she thinks there is another one.

## 2022-08-19 ENCOUNTER — Encounter: Payer: Self-pay | Admitting: Physician Assistant

## 2022-08-19 ENCOUNTER — Other Ambulatory Visit: Payer: Self-pay | Admitting: Physician Assistant

## 2022-08-19 MED ORDER — METFORMIN HCL 500 MG PO TABS: 500.0000 mg | ORAL_TABLET | Freq: Every day | ORAL | 1 refills | Status: DC

## 2022-08-20 ENCOUNTER — Other Ambulatory Visit: Payer: Self-pay | Admitting: Physician Assistant

## 2022-08-20 LAB — CERVICOVAGINAL ANCILLARY ONLY
Bacterial Vaginitis (gardnerella): POSITIVE — AB
Candida Glabrata: NEGATIVE
Candida Vaginitis: NEGATIVE
Chlamydia: POSITIVE — AB
Comment: NEGATIVE
Comment: NEGATIVE
Comment: NEGATIVE
Comment: NEGATIVE
Comment: NEGATIVE
Comment: NORMAL
Neisseria Gonorrhea: NEGATIVE
Trichomonas: NEGATIVE

## 2022-08-20 MED ORDER — DOXYCYCLINE HYCLATE 100 MG PO TABS: 100.0000 mg | ORAL_TABLET | Freq: Two times a day (BID) | ORAL | 0 refills | Status: DC

## 2022-08-20 MED ORDER — METRONIDAZOLE 500 MG PO TABS: 500.0000 mg | ORAL_TABLET | Freq: Two times a day (BID) | ORAL | 0 refills | Status: AC

## 2022-08-22 ENCOUNTER — Ambulatory Visit: Payer: 59 | Admitting: Physician Assistant

## 2022-09-06 ENCOUNTER — Ambulatory Visit: Payer: 59 | Admitting: Physician Assistant

## 2022-09-06 ENCOUNTER — Other Ambulatory Visit (HOSPITAL_COMMUNITY)
Admission: RE | Admit: 2022-09-06 | Discharge: 2022-09-06 | Disposition: A | Discharge status: Home / Self Care | Payer: 59 | Source: Ambulatory Visit | Attending: Physician Assistant | Admitting: Physician Assistant

## 2022-09-06 ENCOUNTER — Encounter: Payer: Self-pay | Admitting: Physician Assistant

## 2022-09-06 VITALS — BP 130/86 | HR 67 | Temp 98.0°F | Ht 68.0 in | Wt 223.2 lb

## 2022-09-06 DIAGNOSIS — N760 Acute vaginitis: Secondary | ICD-10-CM

## 2022-09-06 MED ORDER — FLUCONAZOLE 150 MG PO TABS: ORAL_TABLET | ORAL | 0 refills | Status: DC

## 2022-09-06 NOTE — Progress Notes (Signed)
Elizabeth Brandt is a 21 y.o. female here for a follow up of a pre-existing problem.  History of Present Illness:   Chief Complaint  Patient presents with   Vaginal Itching    Pt states vaginal itching and burning started 20th, after finishing antibiotics on the 18th     Vaginitis Seen by me on 08/16/22 for abnormal menstrual cycle Cervicovaginal swab showed chlamydia and BV She took the doxycycline and flagyl as prescribed She is now having mild burning near vagina and creamy, odorous discharge  She also had to go to the ER for retained tampon since last seeing [place] - denies any concerns from this visit  Denies new sexual partners, urinary sx, concerns for pregnancy  Vaginal bleeding has lessened but is still ongoing and irregular  Past Medical History:  Diagnosis Date   Suicide attempt by acetaminophen overdose (HCC) 04/03/2021   after mom's cancer diagnosis     Social History   Tobacco Use   Smoking status: Never   Smokeless tobacco: Never  Vaping Use   Vaping Use: Never used  Substance Use Topics   Alcohol use: Yes    Alcohol/week: 1.0 - 3.0 standard drink of alcohol    Types: 1 - 3 Shots of liquor per week    Comment: Not weekly   Drug use: Never    History reviewed. No pertinent surgical history.  Family History  Problem Relation Age of Onset   Cancer Mother        soft-tissue cancer   Diabetes Mother    Diabetes Father    Diabetes Sister     No Known Allergies  Current Medications:   Current Outpatient Medications:    benzoyl peroxide (PANOXYL CREAMY WASH) 4 % external liquid, 1 application, Disp: , Rfl:    metFORMIN (GLUCOPHAGE) 500 MG tablet, Take 1 tablet (500 mg total) by mouth daily with breakfast., Disp: 30 tablet, Rfl: 1   naproxen (NAPROSYN) 500 MG tablet, Take 1 tablet (500 mg total) by mouth 2 (two) times daily with a meal., Disp: 30 tablet, Rfl: 0   Review of Systems:   ROS Negative unless otherwise specified per HPI.  Vitals:    Vitals:   09/06/22 1309  BP: 130/86  Pulse: 67  Temp: 98 F (36.7 C)  TempSrc: Temporal  SpO2: 98%  Weight: 223 lb 3.2 oz (101.2 kg)  Height: 5\' 8"  (1.727 m)     Body mass index is 33.94 kg/m.  Physical Exam:   Physical Exam Exam conducted with a chaperone present.  Constitutional:      Appearance: Normal appearance. She is well-developed.  HENT:     Head: Normocephalic and atraumatic.  Eyes:     General: Lids are normal.     Extraocular Movements: Extraocular movements intact.     Conjunctiva/sclera: Conjunctivae normal.  Pulmonary:     Effort: Pulmonary effort is normal.  Genitourinary:    Vagina: Vaginal discharge present.     Cervix: No cervical motion tenderness.  Musculoskeletal:        General: Normal range of motion.     Cervical back: Normal range of motion and neck supple.  Skin:    General: Skin is warm and dry.  Neurological:     Mental Status: She is alert and oriented to person, place, and time.  Psychiatric:        Attention and Perception: Attention and perception normal.        Mood and Affect: Mood normal.  Behavior: Behavior normal.        Thought Content: Thought content normal.        Judgment: Judgment normal.     Assessment and Plan:   Acute vaginitis No red flags Vaginal swab obtained *Was not able to select for gonorrhea to be tested without chlamydia. Chlamydia test may still be positive and we will not treat again. Empirically treat with diflucan for yeast infection. Avoid sexual activity until results return    Jarold Motto, [place]

## 2022-09-06 NOTE — Patient Instructions (Signed)
It was great to see you!  Start the diflucan for your possible yeast infection.  I will be in touch with results!  Take care,  Jarold Motto PA-C

## 2022-09-11 ENCOUNTER — Other Ambulatory Visit: Payer: Self-pay | Admitting: Physician Assistant

## 2022-09-11 LAB — CERVICOVAGINAL ANCILLARY ONLY
Bacterial Vaginitis (gardnerella): POSITIVE — AB
Candida Glabrata: NEGATIVE
Candida Vaginitis: POSITIVE — AB
Chlamydia: NEGATIVE
Comment: NEGATIVE
Comment: NEGATIVE
Comment: NEGATIVE
Comment: NEGATIVE
Comment: NEGATIVE
Comment: NORMAL
Neisseria Gonorrhea: NEGATIVE
Trichomonas: NEGATIVE

## 2022-09-11 MED ORDER — FLUCONAZOLE 150 MG PO TABS: 150.0000 mg | ORAL_TABLET | Freq: Once | ORAL | 0 refills | Status: AC

## 2022-09-11 MED ORDER — METRONIDAZOLE 500 MG PO TABS: 500.0000 mg | ORAL_TABLET | Freq: Two times a day (BID) | ORAL | 0 refills | Status: AC

## 2022-10-14 ENCOUNTER — Other Ambulatory Visit: Payer: Self-pay | Admitting: Physician Assistant

## 2022-11-07 ENCOUNTER — Other Ambulatory Visit: Payer: Self-pay | Admitting: Physician Assistant

## 2023-07-21 IMAGING — CR DG ANKLE COMPLETE 3+V*L*
3 series · 3 of 3 positions shown · non-contrast
Comparison: None Available.

CLINICAL DATA: Patient fell last night, complaining of left lateral
ankle pain.

EXAM:
LEFT ANKLE COMPLETE - 3+ VIEW

[x ankle ap left]
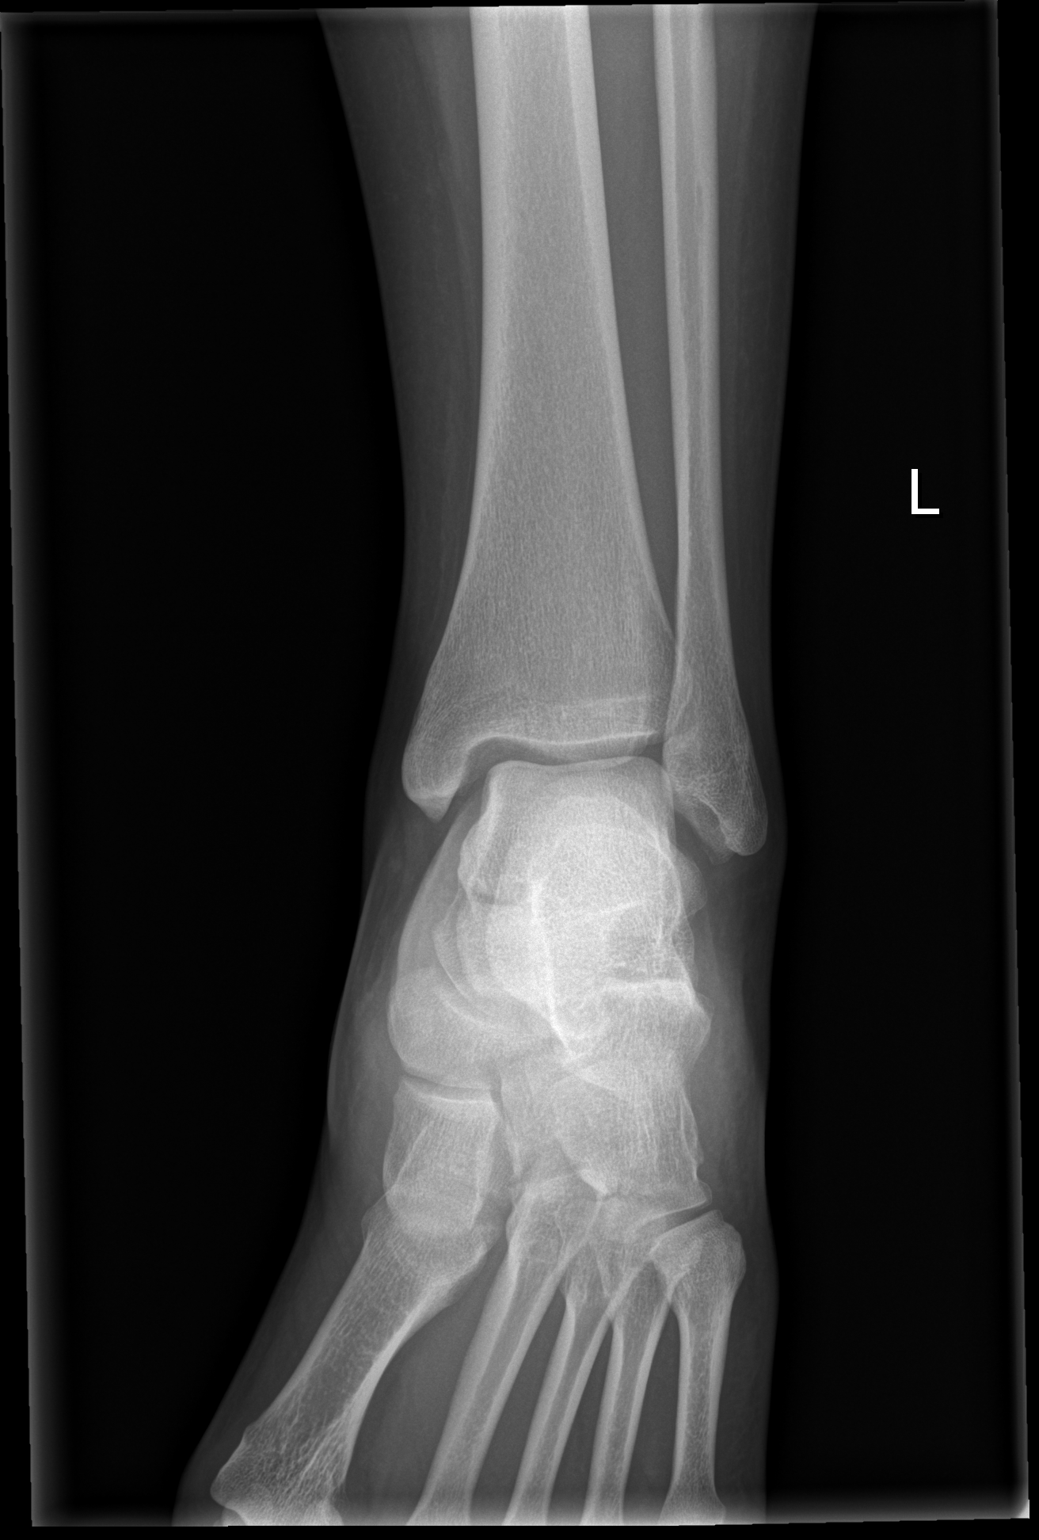

[x ankle obl left]
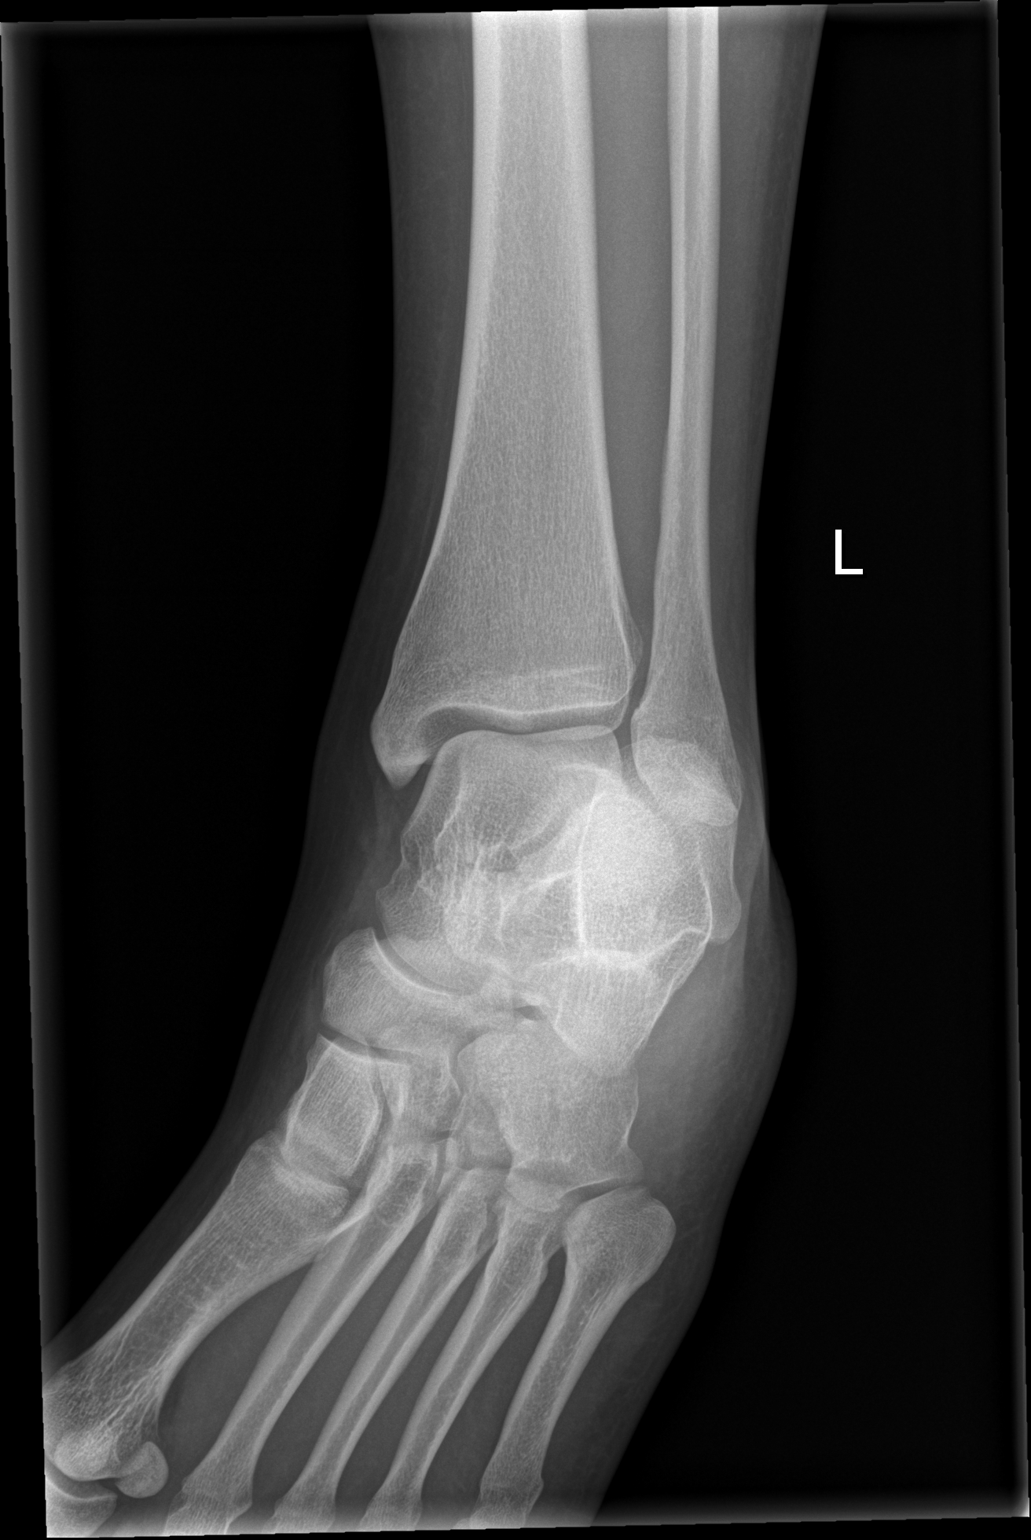

[x ankle lat left]
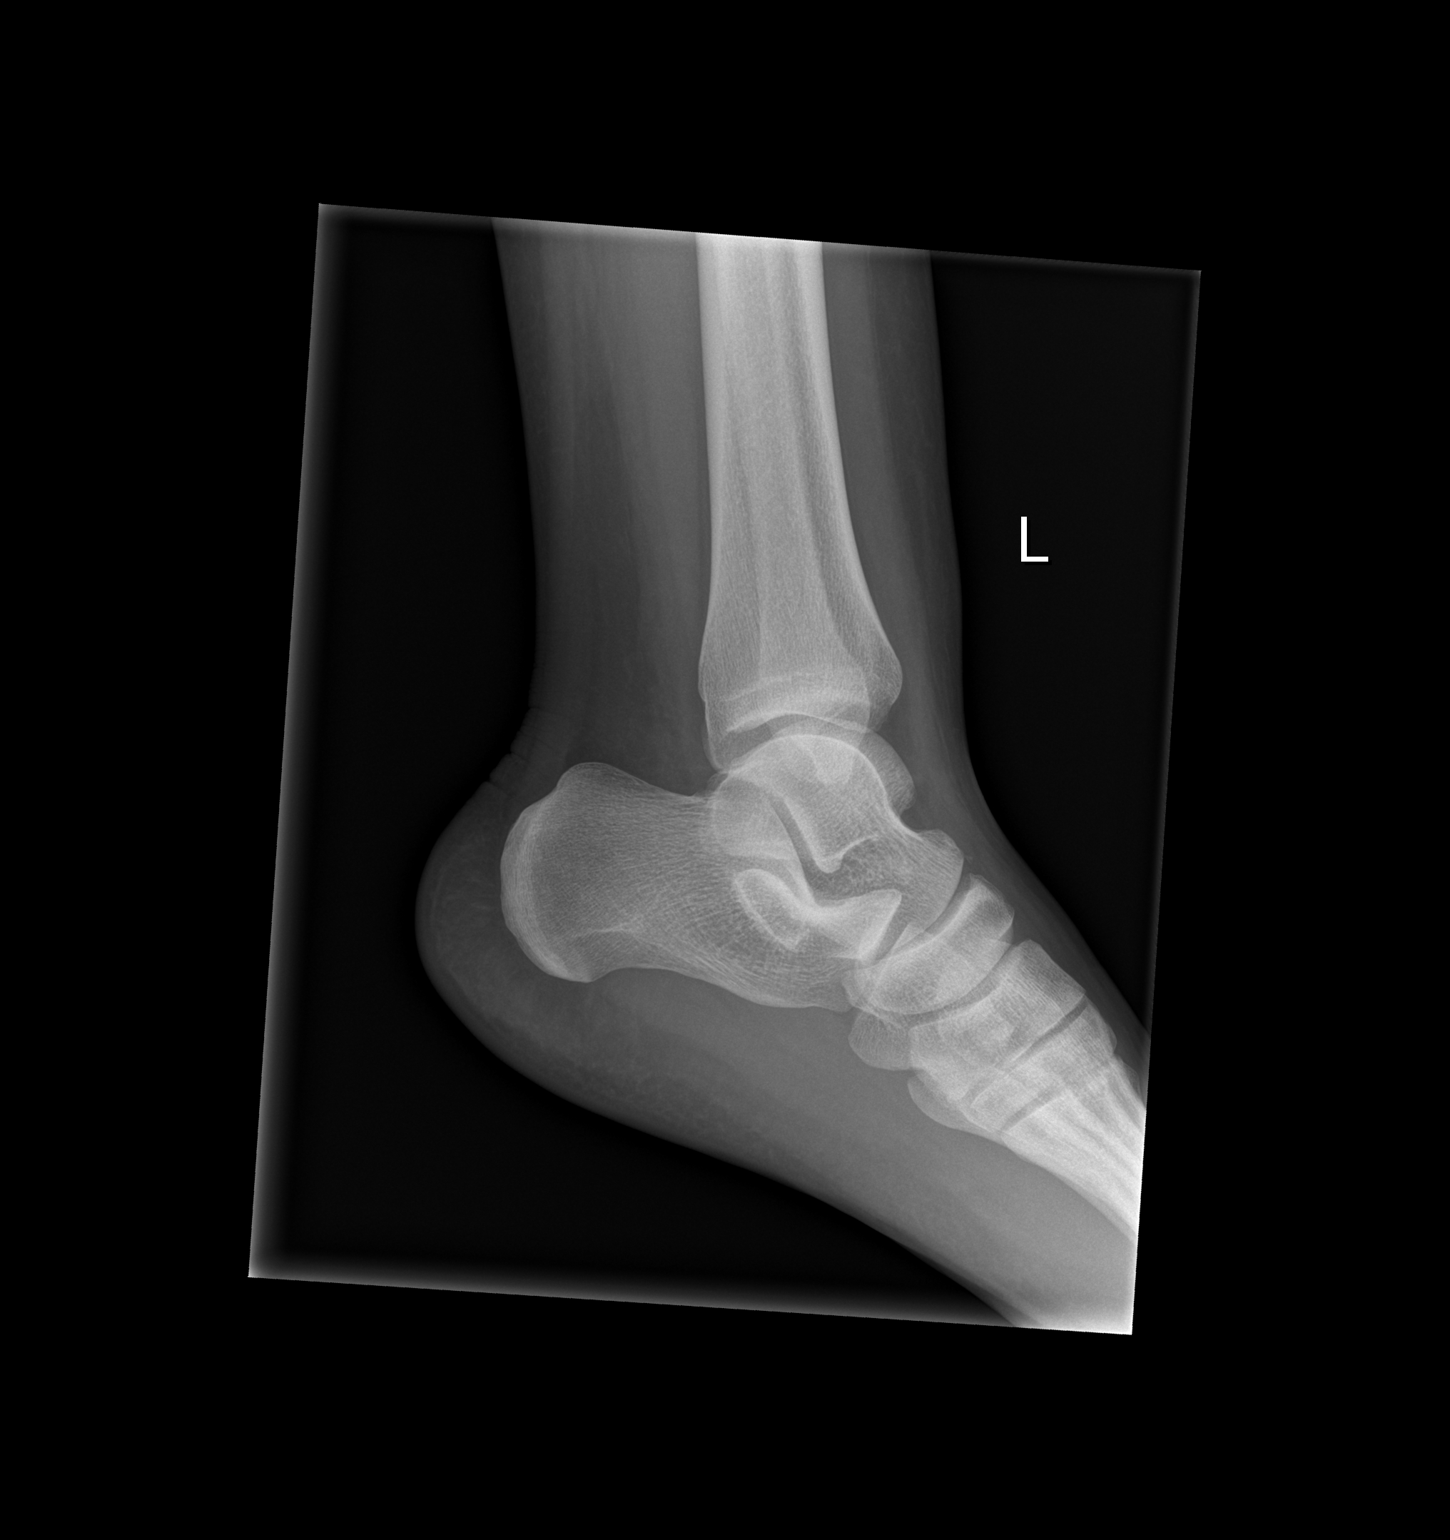

[3 of 3 positions shown; findings below may reference images not displayed]

FINDINGS: There is no evidence of fracture, dislocation, or joint effusion.
There is no evidence of arthropathy or other focal bone abnormality.
Soft tissues are unremarkable.
IMPRESSION: Negative.
